# Patient Record
Sex: Male | Born: 1980 | Hispanic: Yes | Marital: Married | Smoking: Former smoker
Health system: Southern US, Community
[De-identification: ages and names within clinical notes are randomized; demographics above are authoritative.]

## PROBLEM LIST (undated history)

## (undated) DIAGNOSIS — I10 Essential (primary) hypertension: Secondary | ICD-10-CM

## (undated) DIAGNOSIS — J302 Other seasonal allergic rhinitis: Secondary | ICD-10-CM

## (undated) DIAGNOSIS — J45909 Unspecified asthma, uncomplicated: Secondary | ICD-10-CM

## (undated) HISTORY — DX: Unspecified asthma, uncomplicated: J45.909

## (undated) HISTORY — PX: NO PAST SURGERIES: SHX2092

## (undated) HISTORY — DX: Other seasonal allergic rhinitis: J30.2

---

## 2003-09-02 ENCOUNTER — Emergency Department (HOSPITAL_COMMUNITY): Admission: EM | Admit: 2003-09-02 | Discharge: 2003-09-02 | Payer: Self-pay | Admitting: Family Medicine

## 2003-12-02 ENCOUNTER — Encounter: Admission: RE | Admit: 2003-12-02 | Discharge: 2003-12-02 | Payer: Self-pay | Admitting: Specialist

## 2004-01-03 ENCOUNTER — Emergency Department (HOSPITAL_COMMUNITY): Admission: EM | Admit: 2004-01-03 | Discharge: 2004-01-03 | Payer: Self-pay | Admitting: Emergency Medicine

## 2012-03-25 ENCOUNTER — Ambulatory Visit (INDEPENDENT_AMBULATORY_CARE_PROVIDER_SITE_OTHER): Payer: 59 | Admitting: Family Medicine

## 2012-03-25 VITALS — BP 112/82 | HR 70 | Temp 98.1°F | Resp 16 | Ht 67.0 in | Wt 181.0 lb

## 2012-03-25 DIAGNOSIS — K625 Hemorrhage of anus and rectum: Secondary | ICD-10-CM

## 2012-03-25 DIAGNOSIS — IMO0001 Reserved for inherently not codable concepts without codable children: Secondary | ICD-10-CM

## 2012-03-25 DIAGNOSIS — R5381 Other malaise: Secondary | ICD-10-CM

## 2012-03-25 DIAGNOSIS — M791 Myalgia, unspecified site: Secondary | ICD-10-CM

## 2012-03-25 DIAGNOSIS — R42 Dizziness and giddiness: Secondary | ICD-10-CM

## 2012-03-25 DIAGNOSIS — R5383 Other fatigue: Secondary | ICD-10-CM

## 2012-03-25 DIAGNOSIS — R51 Headache: Secondary | ICD-10-CM

## 2012-03-25 LAB — POCT CBC
Granulocyte percent: 61.6 %G (ref 37–80)
HCT, POC: 53.4 % (ref 43.5–53.7)
MCV: 87.3 fL (ref 80–97)
MPV: 8.9 fL (ref 0–99.8)
POC Granulocyte: 3.8 (ref 2–6.9)
POC LYMPH PERCENT: 31.2 %L (ref 10–50)
POC MID %: 7.2 %M (ref 0–12)
Platelet Count, POC: 305 10*3/uL (ref 142–424)
RBC: 6.12 M/uL (ref 4.69–6.13)

## 2012-03-25 LAB — TSH: TSH: 2.4 u[IU]/mL (ref 0.350–4.500)

## 2012-03-25 LAB — COMPREHENSIVE METABOLIC PANEL
Albumin: 4.5 g/dL (ref 3.5–5.2)
Alkaline Phosphatase: 86 U/L (ref 39–117)
BUN: 9 mg/dL (ref 6–23)
Calcium: 9.7 mg/dL (ref 8.4–10.5)
Glucose, Bld: 100 mg/dL — ABNORMAL HIGH (ref 70–99)
Potassium: 4.5 mEq/L (ref 3.5–5.3)

## 2012-03-25 LAB — GLUCOSE, POCT (MANUAL RESULT ENTRY): POC Glucose: 80 mg/dl (ref 70–99)

## 2012-03-25 MED ORDER — MECLIZINE HCL 25 MG PO TABS: ORAL_TABLET | ORAL | Status: DC

## 2012-03-25 MED ORDER — HYDROCORTISONE ACE-PRAMOXINE 1-1 % RE CREA: TOPICAL_CREAM | Freq: Two times a day (BID) | RECTAL | Status: AC

## 2012-03-25 MED ORDER — MELOXICAM 15 MG PO TABS: ORAL_TABLET | ORAL | Status: DC

## 2012-03-25 NOTE — Patient Instructions (Addendum)
Drink lots of water.  Take acetaminophen 500 mg 2 tablets 4 times daily as necessary for aching  Use the prescription medicines for pain and inflammation 1 pill every night with your meals  Use the dizziness pill only when necessary  Use the cream around your bottom twice daily for one week. If you continue seeing more blood come back.

## 2012-03-25 NOTE — Progress Notes (Signed)
Subjective: 31 year old male who has been having problems over the last couple of months with more fatigue. He has headaches, in the frontal and occipital regions. Most of his symptoms seem to come on by midday. He works Holiday representative. He is from [place], married, has 2 small children. The dizziness is just a lightheaded but sometimes spends a little if he makes a quick movement. Mild tinnitus at times. He has been able to continue his work. He gets some epigastric discomfort. He has problems with aching and pains in his legs. He has tried doing some exercise and running of late. He has no pains in his legs from that.  Objective: Healthy-appearing young man in no major distress. His TMs are normal. Eyes PERRLA. EOMs intact. Throat clear. Neck supple without nodes or thyromegaly. No carotid bruits. Chest clear to auscultation. Heart regular without murmurs. Abdomen soft without mass or tenderness. He does describe a little bit of blood on the tissue when he wipes sometimes. Extremities are unremarkable.  Assessment: Headaches Dizziness Fatigue Muscle weakness Rectal irritation  Plan: CBC, complete metabolic panel, TSH, glucose  Results for orders placed in visit on 03/25/12  POCT CBC      Component Value Range   WBC 6.2  4.6 - 10.2 K/uL   Lymph, poc 1.9  0.6 - 3.4   POC LYMPH PERCENT 31.2  10 - 50 %L   MID (cbc) 0.4  0 - 0.9   POC MID % 7.2  0 - 12 %M   POC Granulocyte 3.8  2 - 6.9   Granulocyte percent 61.6  37 - 80 %G   RBC 6.12  4.69 - 6.13 M/uL   Hemoglobin 16.8  14.1 - 18.1 g/dL   HCT, POC 04.5  40.9 - 53.7 %   MCV 87.3  80 - 97 fL   MCH, POC 27.5  27 - 31.2 pg   MCHC 31.5 (*) 31.8 - 35.4 g/dL   RDW, POC 81.1     Platelet Count, POC 305  142 - 424 K/uL   MPV 8.9  0 - 99.8 fL  GLUCOSE, POCT (MANUAL RESULT ENTRY)      Component Value Range   POC Glucose 80  70 - 99 mg/dl   mobic antivert analpram Return if worse

## 2012-03-27 ENCOUNTER — Encounter: Payer: Self-pay | Admitting: *Deleted

## 2012-11-08 ENCOUNTER — Ambulatory Visit (INDEPENDENT_AMBULATORY_CARE_PROVIDER_SITE_OTHER): Payer: 59 | Admitting: Family Medicine

## 2012-11-08 VITALS — BP 130/80 | HR 81 | Temp 97.9°F | Resp 16 | Ht 66.5 in | Wt 187.0 lb

## 2012-11-08 DIAGNOSIS — R079 Chest pain, unspecified: Secondary | ICD-10-CM

## 2012-11-08 DIAGNOSIS — J302 Other seasonal allergic rhinitis: Secondary | ICD-10-CM

## 2012-11-08 DIAGNOSIS — J309 Allergic rhinitis, unspecified: Secondary | ICD-10-CM

## 2012-11-08 DIAGNOSIS — J45909 Unspecified asthma, uncomplicated: Secondary | ICD-10-CM

## 2012-11-08 MED ORDER — ALBUTEROL SULFATE HFA 108 (90 BASE) MCG/ACT IN AERS: 2.0000 | INHALATION_SPRAY | RESPIRATORY_TRACT | Status: DC | PRN

## 2012-11-08 MED ORDER — CETIRIZINE HCL 10 MG PO TABS: 10.0000 mg | ORAL_TABLET | Freq: Every day | ORAL | Status: DC

## 2012-11-08 MED ORDER — PREDNISONE 20 MG PO TABS: ORAL_TABLET | ORAL | Status: DC

## 2012-11-08 MED ORDER — ALBUTEROL SULFATE (2.5 MG/3ML) 0.083% IN NEBU
2.5000 mg | INHALATION_SOLUTION | Freq: Once | RESPIRATORY_TRACT | Status: AC
  Administered 2012-11-08: 2.5 mg via RESPIRATORY_TRACT

## 2012-11-08 MED ORDER — FLUTICASONE PROPIONATE 50 MCG/ACT NA SUSP: 2.0000 | Freq: Every day | NASAL | Status: DC

## 2012-11-08 NOTE — Patient Instructions (Addendum)
Asthma, Acute Bronchospasm Your exam shows you have asthma, or acute bronchospasm that acts like asthma. Bronchospasm means your air passages become narrowed. These conditions are due to inflammation and airway spasm that cause narrowing of the bronchial tubes in the lungs. This causes you to have wheezing and shortness of breath. CAUSES  Respiratory infections and allergies most often bring on these attacks. Smoking, air pollution, cold air, emotional upsets, and vigorous exercise can also bring them on.  TREATMENT   Treatment is aimed at making the narrowed airways larger. Mild asthma/bronchospasm is usually controlled with inhaled medicines. Albuterol is a common medicine that you breathe in to open spastic or narrowed airways. Some trade names for albuterol are Ventolin or Proventil. Steroid medicine is also used to reduce the inflammation when an attack is moderate or severe. Antibiotics (medications used to kill germs) are only used if a bacterial infection is present.  If you are pregnant and need to use Albuterol (Ventolin or Proventil), you can expect the baby to move more than usual shortly after the medicine is used. HOME CARE INSTRUCTIONS   Rest.  Drink plenty of liquids. This helps the mucus to remain thin and easily coughed up. Do not use caffeine or alcohol.  Do not smoke. Avoid being exposed to second-hand smoke.  You play a critical role in keeping yourself in good health. Avoid exposure to things that cause you to wheeze. Avoid exposure to things that cause you to have breathing problems. Keep your medications up-to-date and available. Carefully follow your doctor's treatment plan.  When pollen or pollution is bad, keep windows closed and use an air conditioner go to places with air conditioning. If you are allergic to furry pets or birds, find new homes for them or keep them outside.  Take your medicine exactly as prescribed.  Asthma requires careful medical attention. See  your caregiver for follow-up as advised. If you are more than [redacted] weeks pregnant and you were prescribed any new medications, let your Obstetrician know about the visit and how you are doing. Arrange a recheck. SEEK IMMEDIATE MEDICAL CARE IF:   You are getting worse.  You have trouble breathing. If severe, call 911.  You develop chest pain or discomfort.  You are throwing up or not drinking fluids.  You are not getting better within 24 hours.  You are coughing up yellow, green, brown, or bloody sputum.  You develop a fever over 102 F (38.9 C).  You have trouble swallowing. MAKE SURE YOU:   Understand these instructions.  Will watch your condition.  Will get help right away if you are not doing well or get worse. Document Released: 10/20/2006 Document Revised: 09/27/2011 Document Reviewed: 06/19/2007 [facility] Patient Information 2013 [place], [place]. Asthma, Adult Asthma is caused by narrowing of the air passages in the lungs. It may be triggered by pollen, dust, animal dander, molds, some foods, respiratory infections, exposure to smoke, exercise, emotional stress or other allergens (things that cause allergic reactions or allergies). Repeat attacks are common. HOME CARE INSTRUCTIONS   Use prescription medications as ordered by your caregiver.  Avoid pollen, dust, animal dander, molds, smoke and other things that cause attacks at home and at work.  You may have fewer attacks if you decrease dust in your home. Electrostatic air cleaners may help.  It may help to replace your pillows or mattress with materials less likely to cause allergies.  Talk to your caregiver about an action plan for managing asthma attacks at home,  including, the use of a peak flow meter which measures the severity of your asthma attack. An action plan can help minimize or stop the attack without having to seek medical care.  If you are not on a fluid restriction, drink 8 to 10 glasses of water each  day.  Always have a plan prepared for seeking medical attention, including, calling your physician, accessing local emergency care, and calling 911 (in the [place] for a severe attack.  Discuss possible exercise routines with your caregiver.  If animal dander is the cause of asthma, you may need to get rid of pets. SEEK MEDICAL CARE IF:   You have wheezing and shortness of breath even if taking medicine to prevent attacks.  You have muscle aches, chest pain or thickening of sputum.  Your sputum changes from clear or white to yellow, green, gray, or bloody.  You have any problems that may be related to the medicine you are taking (such as a rash, itching, swelling or trouble breathing). SEEK IMMEDIATE MEDICAL CARE IF:   Your usual medicines do not stop your wheezing or there is increased coughing and/or shortness of breath.  You have increased difficulty breathing.  You have a fever. MAKE SURE YOU:   Understand these instructions.  Will watch your condition.  Will get help right away if you are not doing well or get worse. Document Released: 07/05/2005 Document Revised: 09/27/2011 Document Reviewed: 02/21/2008 [facility] Patient Information 2013 [place], [place].

## 2012-11-08 NOTE — Progress Notes (Signed)
Subjective:    Patient ID: Darren Giles, male    DOB: 11-Nov-1980, 32 y.o.   MRN: [id number] Chief Complaint  Patient presents with  . Chest Pain   HPI  For the past 2 wks, he has had severe allergies.  Had had a chest pain and throat feels swollen closed.  Does get severe seasonal allergies every year.  Chest feels like lots of pressure and throat feels swollen keeping him from sleep last night.  Chest pain has been constant and central x 2 wks, worse w/ cough.  Feels like he has a fever but no sweats.  Is having some HA and sinus pressure as well as neck pain.  No ear pain or teeth pain. Throat feels very dry and pain with swallowing. Eyes itching and watering.  Using some otc outdoor allergy medicine for the past 1-2 wks - unknown type but it is 1 every day per box.  Has been hearing himself wheeze, but acutally not coughing to much.  Did have asthma a long time ago but only occ flairs up when he gets ill.  Past Medical History  Diagnosis Date  . Seasonal allergies    No current outpatient prescriptions on file prior to visit.   No current facility-administered medications on file prior to visit.   No Known Allergies   Review of Systems  Constitutional: Positive for fever and fatigue. Negative for chills, diaphoresis, activity change, appetite change and unexpected weight change.  HENT: Positive for congestion, sore throat, rhinorrhea, sneezing, trouble swallowing, neck pain, neck stiffness, postnasal drip and sinus pressure. Negative for ear pain, mouth sores and dental problem.   Eyes: Positive for discharge and itching.  Respiratory: Positive for cough, chest tightness, shortness of breath and wheezing.   Cardiovascular: Positive for chest pain.  Gastrointestinal: Negative for nausea, vomiting, abdominal pain, diarrhea and constipation.  Genitourinary: Negative for dysuria.  Musculoskeletal: Negative for myalgias and arthralgias.  Skin: Negative for rash.  Neurological:  Positive for tremors and headaches. Negative for syncope.  Hematological: Negative for adenopathy.  Psychiatric/Behavioral: Positive for sleep disturbance.      BP 130/80  Pulse 81  Temp(Src) 97.9 F (36.6 C) (Oral)  Resp 16  Ht 5' 6.5" (1.689 m)  Wt 187 lb (84.823 kg)  BMI 29.73 kg/m2  SpO2 99%  PF 450 L/min Goal peak flow 620 Objective:   Physical Exam  Constitutional: He is oriented to person, place, and time. He appears well-developed and well-nourished. No distress.  HENT:  Head: Normocephalic and atraumatic.  Right Ear: External ear and ear canal normal. Tympanic membrane is retracted. A middle ear effusion is present.  Left Ear: External ear and ear canal normal. Tympanic membrane is retracted. A middle ear effusion is present.  Nose: Mucosal edema and rhinorrhea present. Right sinus exhibits maxillary sinus tenderness. Right sinus exhibits no frontal sinus tenderness. Left sinus exhibits maxillary sinus tenderness. Left sinus exhibits no frontal sinus tenderness.  Mouth/Throat: Uvula is midline and mucous membranes are normal. No oropharyngeal exudate, posterior oropharyngeal edema or posterior oropharyngeal erythema.  Eyes: Conjunctivae are normal. Right eye exhibits no discharge. Left eye exhibits no discharge. No scleral icterus.  Neck: Normal range of motion. Neck supple. No thyromegaly present.  Cardiovascular: Normal rate, regular rhythm, normal heart sounds and intact distal pulses.   Pulmonary/Chest: Effort normal. No respiratory distress. He has decreased breath sounds. He has no wheezes. He has no rhonchi. He has no rales.  Diffusely decreased breath sounds with bibasilar wheezing with  forced expiration. After neb treatment, much improved air movement with occ RUL exp wheeze with forced exp but cleared w/ deep breathing.  Lymphadenopathy:       Head (right side): No submandibular, no tonsillar, no preauricular and no posterior auricular adenopathy present.       Head  (left side): No submandibular, no tonsillar, no preauricular and no posterior auricular adenopathy present.    He has no cervical adenopathy.       Right: No supraclavicular adenopathy present.       Left: No supraclavicular adenopathy present.  Neurological: He is alert and oriented to person, place, and time.  Skin: Skin is warm and dry. He is not diaphoretic. No erythema.  Psychiatric: He has a normal mood and affect. His behavior is normal.    [facility] reading (PRIMARY) by  Dr. Clelia Croft. EKG: NSR, no ischemic changes    s/p 2 nebs in office Assessment & Plan:  Seasonal allergic rhinitis  Chest pain - Plan: EKG 12-Lead  Reactive airway disease with wheezing - Plan: albuterol (PROVENTIL) (2.5 MG/3ML) 0.083% nebulizer solution 2.5 mg, albuterol (PROVENTIL) (2.5 MG/3ML) 0.083% nebulizer solution 2.5 mg alb neb x 2 given in office. Start prednisone taper w/ alb, flonase, and zyrtec.  I don't think pt needs an antibiotic at this time but if he gets worse w/ f, c, prod cough - call and can start him on doxy or a zpack.  Meds ordered this encounter  Medications  . albuterol (PROVENTIL) (2.5 MG/3ML) 0.083% nebulizer solution 2.5 mg    Sig:   . albuterol (PROVENTIL) (2.5 MG/3ML) 0.083% nebulizer solution 2.5 mg    Sig:   . predniSONE (DELTASONE) 20 MG tablet    Sig: Take 3 tabs daily x 3d, 2 tabs daily x 3d, 1 tab daily x 3d    Dispense:  18 tablet    Refill:  0  . fluticasone (FLONASE) 50 MCG/ACT nasal spray    Sig: Place 2 sprays into the nose daily.    Dispense:  16 g    Refill:  6  . albuterol (PROVENTIL HFA;VENTOLIN HFA) 108 (90 BASE) MCG/ACT inhaler    Sig: Inhale 2 puffs into the lungs every 4 (four) hours as needed for wheezing (cough, shortness of breath or wheezing.).    Dispense:  1 Inhaler    Refill:  3  . cetirizine (ZYRTEC) 10 MG tablet    Sig: Take 1 tablet (10 mg total) by mouth daily.    Dispense:  30 tablet    Refill:  3

## 2014-02-23 ENCOUNTER — Ambulatory Visit (INDEPENDENT_AMBULATORY_CARE_PROVIDER_SITE_OTHER): Payer: Self-pay

## 2014-02-23 ENCOUNTER — Ambulatory Visit (INDEPENDENT_AMBULATORY_CARE_PROVIDER_SITE_OTHER): Payer: Self-pay | Admitting: Emergency Medicine

## 2014-02-23 VITALS — BP 120/86 | HR 99 | Temp 97.7°F | Resp 16 | Ht 66.5 in | Wt 180.0 lb

## 2014-02-23 DIAGNOSIS — M549 Dorsalgia, unspecified: Secondary | ICD-10-CM

## 2014-02-23 MED ORDER — MELOXICAM 7.5 MG PO TABS: 7.5000 mg | ORAL_TABLET | Freq: Every day | ORAL | Status: DC

## 2014-02-23 MED ORDER — CYCLOBENZAPRINE HCL 10 MG PO TABS: ORAL_TABLET | ORAL | Status: DC

## 2014-02-23 NOTE — Progress Notes (Addendum)
Subjective:  This chart was scribed for Lesle ChrisSteven Daub, MD by Elveria Risingimelie Horne, Medial Scribe. This patient was seen in room 3 and the patient's care was started at 3:40 PM.    Patient ID: Darren Giles, male    DOB: 01-Jun-1981, 33 y.o.   MRN: [id number]  HPI HPI Comments: Darren Giles is a 33 y.o. male who presents to the [facility] complaining of right shoulder pain, ongoing for five weeks. Patient is a laborer and operates heavy equipment. Patient reports treatment with ibuprofen, but denies relief. Patient reports same pain of few months ago his current pain is more severe. Patients denies weakness or tingling in his extremities or hands; stating the pain is non-radiating.   Patient reports tick bite six months ago. He now has multiple ingrown hairs in the scar resulting from the bite.   There are no active problems to display for this patient.  Past Medical History  Diagnosis Date  . Seasonal allergies    No past surgical history on file. No Known Allergies Prior to Admission medications   Not on File   History   Social History  . Marital Status: Married    Spouse Name: N/A    Number of Children: N/A  . Years of Education: N/A   Occupational History  . Not on file.   Social History Main Topics  . Smoking status: Former Games developermoker  . Smokeless tobacco: Not on file  . Alcohol Use: Yes     Comment: social use  . Drug Use: No  . Sexual Activity: Not on file   Other Topics Concern  . Not on file   Social History Narrative  . No narrative on file    Review of Systems  Constitutional: Negative for fever and chills.  Musculoskeletal: Positive for arthralgias and back pain.  Neurological: Negative for weakness and numbness.       Objective:   Physical Exam  Nursing note and vitals reviewed.   CONSTITUTIONAL: Well developed/well nourished HEAD: Normocephalic/atraumatic EYES: EOMI/PERRL ENMT: Mucous membranes moist NECK: supple no meningeal  signs SPINE:entire spine nontender CV: S1/S2 noted, no murmurs/rubs/gallops noted LUNGS: Lungs are clear to auscultation bilaterally, no apparent distress ABDOMEN: soft, nontender, no rebound or guarding; 3x14mm red area adjacent to belly button showing multiple ingrown hairs which were removed with forceps and 25 gauge needle GU:no cva tenderness NEURO: Pt is awake/alert, moves all extremitiesx4 EXTREMITIES: pulses normal, full ROM SHOULDER: tender along lower medial border of scapula on the right SKIN: warm, color normal PSYCH: no abnormalities of mood noted  Filed Vitals:   02/23/14 1535  BP: 120/86  Pulse: 99  Temp: 97.7 F (36.5 C)  TempSrc: Oral  Resp: 16  Height: 5' 6.5" (1.689 m)  Weight: 180 lb (81.647 kg)  SpO2: 99%   [facility] reading (PRIMARY) by  Dr.Daub on the lateral view there is a questionable irregular 2 x 3 cm spiculated area superior to the hilum     Assessment & Plan:  I'm concerned about the area on his chest x-ray. I did remove what appears to be some ingrown hairs right side of the abdomen. Will give him Mobic and Flexeril for his shoulder pain. The radiologist feels a chest x-ray abnormality is secondary to overlap of shadows. The patient is going to come back in 3 months for repeat chest x-ray just to be safe. Treat him for shoulder pain. He also was advised he may need to return to clinic to remove  some more ingrown hairs right side of the abdomen.  I personally performed the services described in this documentation, which was scribed in my presence. The recorded information has been reviewed and is accurate.

## 2014-04-19 ENCOUNTER — Ambulatory Visit (INDEPENDENT_AMBULATORY_CARE_PROVIDER_SITE_OTHER): Payer: BC Managed Care – PPO

## 2014-04-19 ENCOUNTER — Ambulatory Visit (INDEPENDENT_AMBULATORY_CARE_PROVIDER_SITE_OTHER): Payer: BC Managed Care – PPO | Admitting: Emergency Medicine

## 2014-04-19 VITALS — BP 118/80 | HR 77 | Temp 97.6°F | Resp 18 | Ht 67.0 in | Wt 181.0 lb

## 2014-04-19 DIAGNOSIS — M25511 Pain in right shoulder: Secondary | ICD-10-CM

## 2014-04-19 DIAGNOSIS — R079 Chest pain, unspecified: Secondary | ICD-10-CM

## 2014-04-19 LAB — POCT CBC
Granulocyte percent: 56.5 %G (ref 37–80)
HEMATOCRIT: 50.9 % (ref 43.5–53.7)
Hemoglobin: 16.8 g/dL (ref 14.1–18.1)
Lymph, poc: 2.2 (ref 0.6–3.4)
MCH: 28.1 pg (ref 27–31.2)
MCHC: 33 g/dL (ref 31.8–35.4)
MCV: 85.1 fL (ref 80–97)
MID (CBC): 0.4 (ref 0–0.9)
MPV: 7.6 fL (ref 0–99.8)
PLATELET COUNT, POC: 240 10*3/uL (ref 142–424)
POC Granulocyte: 3.3 (ref 2–6.9)
POC LYMPH PERCENT: 37.2 %L (ref 10–50)
POC MID %: 6.3 %M (ref 0–12)
RBC: 5.98 M/uL (ref 4.69–6.13)
RDW, POC: 12.6 %
WBC: 5.8 10*3/uL (ref 4.6–10.2)

## 2014-04-19 LAB — POCT SEDIMENTATION RATE: POCT SED RATE: 4 mm/hr (ref 0–22)

## 2014-04-19 MED ORDER — NABUMETONE 500 MG PO TABS: 500.0000 mg | ORAL_TABLET | Freq: Two times a day (BID) | ORAL | Status: DC

## 2014-04-19 MED ORDER — TIZANIDINE HCL 4 MG PO CAPS: ORAL_CAPSULE | ORAL | Status: DC

## 2014-04-19 NOTE — Progress Notes (Signed)
   Subjective:    Patient ID: Judithann SheenManuel Perez, male    DOB: 21-Aug-1980, 33 y.o.   MRN: [id number]  HPI since his last visit here patient continues to have right-sided chest discomfort as well as a rubbing sensation in his left anterior chest. He feels this when he takes a full breath in. He denies any shortness of breath he is nonsmoker he has not coughed up any blood he has not clinically been ill except he complains of persistent fatigue he does work as a Corporate investment bankerconstruction worker    Review of Systems     Objective:   Physical Exam  Constitutional: He appears well-developed and well-nourished.  HENT:  Head: Normocephalic.  Eyes: Pupils are equal, round, and reactive to light.  Neck: No thyromegaly present.  Cardiovascular: Normal rate and regular rhythm.   When I listened to the patient I felt I could hear a rub. Partners Dr. Katrinka BlazingSmith and Dallas Schimkeopeland cannot appreciate a rub.  EKG J-point elevation V1 V2 no acute changes [facility] reading (PRIMARY) by  Dr Cleta Albertsaub no acute disease Results for orders placed in visit on 04/19/14  POCT CBC      Result Value Ref Range   WBC 5.8  4.6 - 10.2 K/uL   Lymph, poc 2.2  0.6 - 3.4   POC LYMPH PERCENT 37.2  10 - 50 %L   MID (cbc) 0.4  0 - 0.9   POC MID % 6.3  0 - 12 %M   POC Granulocyte 3.3  2 - 6.9   Granulocyte percent 56.5  37 - 80 %G   RBC 5.98  4.69 - 6.13 M/uL   Hemoglobin 16.8  14.1 - 18.1 g/dL   HCT, POC 40.950.9  81.143.5 - 53.7 %   MCV 85.1  80 - 97 fL   MCH, POC 28.1  27 - 31.2 pg   MCHC 33.0  31.8 - 35.4 g/dL   RDW, POC 91.412.6     Platelet Count, POC 240  142 - 424 K/uL   MPV 7.6  0 - 99.8 fL  POCT SEDIMENTATION RATE      Result Value Ref Range   POCT SED RATE 4  0 - 22 mm/hr         Assessment & Plan:  I initially thought I could hear a rub but this could not be confirmed. His sedimentation rate is only 4 and his EKG is without acute changes and chest x-ray is normal. We'll treat this as musculoskeletal pain with a muscle relaxant at night and Relafen  twice a day

## 2014-04-20 LAB — COMPREHENSIVE METABOLIC PANEL
ALK PHOS: 99 U/L (ref 39–117)
ALT: 24 U/L (ref 0–53)
AST: 20 U/L (ref 0–37)
Albumin: 4.5 g/dL (ref 3.5–5.2)
BILIRUBIN TOTAL: 0.3 mg/dL (ref 0.2–1.2)
BUN: 9 mg/dL (ref 6–23)
CO2: 28 meq/L (ref 19–32)
Calcium: 9.4 mg/dL (ref 8.4–10.5)
Chloride: 101 mEq/L (ref 96–112)
Creat: 0.91 mg/dL (ref 0.50–1.35)
Glucose, Bld: 102 mg/dL — ABNORMAL HIGH (ref 70–99)
Potassium: 4.2 mEq/L (ref 3.5–5.3)
SODIUM: 137 meq/L (ref 135–145)
TOTAL PROTEIN: 7.3 g/dL (ref 6.0–8.3)

## 2014-04-20 LAB — C-REACTIVE PROTEIN: CRP: <0.5 mg/dL (ref <0.60)

## 2014-04-20 LAB — RHEUMATOID FACTOR: Rhuematoid fact SerPl-aCnc: <10 IU/mL (ref <=14)

## 2014-04-22 LAB — ANA: ANA: NEGATIVE

## 2015-08-04 ENCOUNTER — Ambulatory Visit (INDEPENDENT_AMBULATORY_CARE_PROVIDER_SITE_OTHER): Payer: BLUE CROSS/BLUE SHIELD | Admitting: Urgent Care

## 2015-08-04 VITALS — BP 120/80 | HR 92 | Temp 98.2°F | Resp 20 | Ht 67.72 in | Wt 183.2 lb

## 2015-08-04 DIAGNOSIS — R109 Unspecified abdominal pain: Secondary | ICD-10-CM

## 2015-08-04 DIAGNOSIS — R300 Dysuria: Secondary | ICD-10-CM

## 2015-08-04 DIAGNOSIS — R350 Frequency of micturition: Secondary | ICD-10-CM

## 2015-08-04 DIAGNOSIS — R339 Retention of urine, unspecified: Secondary | ICD-10-CM

## 2015-08-04 DIAGNOSIS — E860 Dehydration: Secondary | ICD-10-CM | POA: Diagnosis not present

## 2015-08-04 DIAGNOSIS — J029 Acute pharyngitis, unspecified: Secondary | ICD-10-CM

## 2015-08-04 DIAGNOSIS — K30 Functional dyspepsia: Secondary | ICD-10-CM | POA: Diagnosis not present

## 2015-08-04 LAB — POCT URINALYSIS DIP (MANUAL ENTRY)
Bilirubin, UA: NEGATIVE
Blood, UA: NEGATIVE
GLUCOSE UA: NEGATIVE
Ketones, POC UA: NEGATIVE
LEUKOCYTES UA: NEGATIVE
NITRITE UA: NEGATIVE
PROTEIN UA: NEGATIVE
Spec Grav, UA: >=1.03
UROBILINOGEN UA: 0.2
pH, UA: 5.5

## 2015-08-04 LAB — COMPREHENSIVE METABOLIC PANEL
ALT: 19 U/L (ref 9–46)
AST: 15 U/L (ref 10–40)
Albumin: 4.4 g/dL (ref 3.6–5.1)
Alkaline Phosphatase: 96 U/L (ref 40–115)
BUN: 8 mg/dL (ref 7–25)
CHLORIDE: 99 mmol/L (ref 98–110)
CO2: 29 mmol/L (ref 20–31)
CREATININE: 0.88 mg/dL (ref 0.60–1.35)
Calcium: 9.2 mg/dL (ref 8.6–10.3)
GLUCOSE: 85 mg/dL (ref 65–99)
POTASSIUM: 3.9 mmol/L (ref 3.5–5.3)
SODIUM: 137 mmol/L (ref 135–146)
TOTAL PROTEIN: 7.3 g/dL (ref 6.1–8.1)
Total Bilirubin: 0.4 mg/dL (ref 0.2–1.2)

## 2015-08-04 LAB — POCT CBC
Granulocyte percent: 71.6 %G (ref 37–80)
HCT, POC: 50.4 % (ref 43.5–53.7)
HEMOGLOBIN: 17.4 g/dL (ref 14.1–18.1)
Lymph, poc: 1.3 (ref 0.6–3.4)
MCH, POC: 28.6 pg (ref 27–31.2)
MCHC: 34.6 g/dL (ref 31.8–35.4)
MCV: 82.5 fL (ref 80–97)
MID (CBC): 0.3 (ref 0–0.9)
MPV: 7.1 fL (ref 0–99.8)
PLATELET COUNT, POC: 219 10*3/uL (ref 142–424)
POC Granulocyte: 4 (ref 2–6.9)
POC LYMPH %: 23.8 % (ref 10–50)
POC MID %: 4.6 %M (ref 0–12)
RBC: 6.11 M/uL (ref 4.69–6.13)
RDW, POC: 12.3 %
WBC: 5.6 10*3/uL (ref 4.6–10.2)

## 2015-08-04 LAB — POCT INFLUENZA A/B
Influenza A, POC: NEGATIVE
Influenza B, POC: NEGATIVE

## 2015-08-04 LAB — POC MICROSCOPIC URINALYSIS (UMFC)

## 2015-08-04 LAB — POCT GLYCOSYLATED HEMOGLOBIN (HGB A1C): HEMOGLOBIN A1C: 5.5

## 2015-08-04 NOTE — Progress Notes (Signed)
MRN: [id number] DOB: 06-15-1981  Subjective:   Darren Giles is a 35 y.o. male presenting for chief complaint of Fever; Cough; Sore Throat; and Dizziness  Reports 4 day history of bilateral flank pain, subjective fever, intermittent dizziness, sinus pressure, sinus pain, sore throat, productive cough, dark urine, mild dysuria, malaise, intermittent nausea and mild belly pain, intermittent constipation. Patient admits polyuria, polydipsia. Patient eats very unhealthily, does not exercise, works as a Location managermachine operator in [employer]. Denies family history of diabetes.  Darren Giles currently has no medications in their medication list. Also has No Known Allergies.  Darren Giles  has a past medical history of Seasonal allergies and Asthma. Also  has no past surgical history on file.  Objective:   Vitals: BP 120/80 mmHg  Pulse 92  Temp(Src) 98.2 F (36.8 C) (Oral)  Resp 20  Ht 5' 7.72" (1.72 m)  Wt 183 lb 3.2 oz (83.099 kg)  BMI 28.09 kg/m2  SpO2 98%  Physical Exam  Constitutional: He is oriented to person, place, and time. He appears well-developed and well-nourished.  HENT:  TM's intact bilaterally, no effusions or erythema. Nasal turbinates erythematous with thick yellow mucus. Nasal passages minimally patent. Mild bilateral maxillary sinus tenderness. Oropharynx without exudates, erythema or abscesses.  Eyes: Right eye exhibits no discharge. Left eye exhibits no discharge. No scleral icterus.  Neck: Normal range of motion. Neck supple.  Cardiovascular: Normal rate, regular rhythm and intact distal pulses.  Exam reveals no gallop and no friction rub.   No murmur heard. Pulmonary/Chest: No respiratory distress. He has no wheezes. He has no rales.  Abdominal: Soft. Bowel sounds are normal. He exhibits no distension and no mass. There is tenderness (generalized).  Lymphadenopathy:    He has no cervical adenopathy.  Neurological: He is alert and oriented to person, place, and time.  Skin:  Skin is warm and dry. No rash noted. No erythema. No pallor.   Results for orders placed or performed in visit on 08/04/15 (from the past 24 hour(s))  POCT urinalysis dipstick     Status: None   Collection Time: 08/04/15  2:38 PM  Result Value Ref Range   Color, UA yellow yellow   Clarity, UA clear clear   Glucose, UA negative negative   Bilirubin, UA negative negative   Ketones, POC UA negative negative   Spec Grav, UA >=1.030    Blood, UA negative negative   pH, UA 5.5    Protein Ur, POC negative negative   Urobilinogen, UA 0.2    Nitrite, UA Negative Negative   Leukocytes, UA Negative Negative  POCT Microscopic Urinalysis ([facility])     Status: Abnormal   Collection Time: 08/04/15  2:38 PM  Result Value Ref Range   WBC,UR,HPF,POC Few (A) None WBC/hpf   RBC,UR,HPF,POC None None RBC/hpf   Bacteria None None, Too numerous to count   Mucus Present (A) Absent   Epithelial Cells, UR Per Microscopy None None, Too numerous to count cells/hpf  POCT CBC     Status: None   Collection Time: 08/04/15  2:38 PM  Result Value Ref Range   WBC 5.6 4.6 - 10.2 K/uL   Lymph, poc 1.3 0.6 - 3.4   POC LYMPH PERCENT 23.8 10 - 50 %L   MID (cbc) 0.3 0 - 0.9   POC MID % 4.6 0 - 12 %M   POC Granulocyte 4.0 2 - 6.9   Granulocyte percent 71.6 37 - 80 %G   RBC 6.11 4.69 - 6.13 M/uL  Hemoglobin 17.4 14.1 - 18.1 g/dL   HCT, POC 16.1 09.6 - 53.7 %   MCV 82.5 80 - 97 fL   MCH, POC 28.6 27 - 31.2 pg   MCHC 34.6 31.8 - 35.4 g/dL   RDW, POC 04.5 %   Platelet Count, POC 219 142 - 424 K/uL   MPV 7.1 0 - 99.8 fL  POCT glycosylated hemoglobin (Hb A1C)     Status: None   Collection Time: 08/04/15  2:40 PM  Result Value Ref Range   Hemoglobin A1C 5.5   POCT Influenza A/B     Status: None   Collection Time: 08/04/15  2:40 PM  Result Value Ref Range   Influenza A, POC Negative Negative   Influenza B, POC Negative Negative   Assessment and Plan :   1. Flank pain 2. Belly pain 3. Incomplete emptying of  bladder 4. Sore throat 5. Dysuria 6. Urinary frequency 7. Dehydration - Labs pending. POC labs and physical exam findings very reassuring. Likely undergoing viral syndrome. Patient had significant improvement with 2L of fluids in clinic. Advised supportive care for now. RTC in 2 days if no improvement.  Wallis Bamberg, PA-C [facility] [phone] 08/04/2015 1:57 PM

## 2015-08-04 NOTE — Patient Instructions (Signed)
-   Por favor descansa y reposa. - Necesito que comas comidas livianas y que tomes suficiente agua, minimo 2 litros al dia. Puedes comer sopas con arros o vegetales.  - Si no mejoras en un par de dias, por favor regresa a nuestra clinica.

## 2015-08-05 ENCOUNTER — Encounter: Payer: Self-pay | Admitting: Urgent Care

## 2015-12-09 ENCOUNTER — Ambulatory Visit (INDEPENDENT_AMBULATORY_CARE_PROVIDER_SITE_OTHER): Payer: BLUE CROSS/BLUE SHIELD | Admitting: Physician Assistant

## 2015-12-09 VITALS — BP 122/82 | HR 77 | Temp 97.8°F | Resp 16 | Ht 67.0 in | Wt 189.0 lb

## 2015-12-09 DIAGNOSIS — R42 Dizziness and giddiness: Secondary | ICD-10-CM

## 2015-12-09 DIAGNOSIS — R51 Headache: Secondary | ICD-10-CM

## 2015-12-09 DIAGNOSIS — R519 Headache, unspecified: Secondary | ICD-10-CM

## 2015-12-09 LAB — POCT CBC
GRANULOCYTE PERCENT: 65.9 % (ref 37–80)
HCT, POC: 46.7 % (ref 43.5–53.7)
Hemoglobin: 16.6 g/dL (ref 14.1–18.1)
Lymph, poc: 2.3 (ref 0.6–3.4)
MCH: 29.6 pg (ref 27–31.2)
MCHC: 35.6 g/dL — AB (ref 31.8–35.4)
MCV: 83.2 fL (ref 80–97)
MID (cbc): 0.2 (ref 0–0.9)
MPV: 7.7 fL (ref 0–99.8)
PLATELET COUNT, POC: 243 10*3/uL (ref 142–424)
POC Granulocyte: 4.8 (ref 2–6.9)
POC LYMPH PERCENT: 31 %L (ref 10–50)
POC MID %: 3.1 %M (ref 0–12)
RBC: 5.61 M/uL (ref 4.69–6.13)
RDW, POC: 12.6 %
WBC: 7.3 10*3/uL (ref 4.6–10.2)

## 2015-12-09 LAB — POCT URINALYSIS DIP (MANUAL ENTRY)
BILIRUBIN UA: NEGATIVE
Glucose, UA: NEGATIVE
Ketones, POC UA: NEGATIVE
Leukocytes, UA: NEGATIVE
Nitrite, UA: NEGATIVE
PH UA: 5.5
PROTEIN UA: NEGATIVE
RBC UA: NEGATIVE
Spec Grav, UA: 1.015
UROBILINOGEN UA: 0.2

## 2015-12-09 LAB — COMPLETE METABOLIC PANEL WITH GFR
ALBUMIN: 4.1 g/dL (ref 3.6–5.1)
ALK PHOS: 100 U/L (ref 40–115)
ALT: 19 U/L (ref 9–46)
AST: 15 U/L (ref 10–40)
BUN: 8 mg/dL (ref 7–25)
CO2: 28 mmol/L (ref 20–31)
Calcium: 9.2 mg/dL (ref 8.6–10.3)
Chloride: 101 mmol/L (ref 98–110)
Creat: 0.86 mg/dL (ref 0.60–1.35)
GFR, Est African American: >89 mL/min (ref >=60)
GLUCOSE: 94 mg/dL (ref 65–99)
POTASSIUM: 3.9 mmol/L (ref 3.5–5.3)
SODIUM: 137 mmol/L (ref 135–146)
Total Bilirubin: 0.4 mg/dL (ref 0.2–1.2)
Total Protein: 6.9 g/dL (ref 6.1–8.1)

## 2015-12-09 LAB — POCT SEDIMENTATION RATE: POCT SED RATE: 4 mm/h (ref 0–22)

## 2015-12-09 LAB — POCT GLYCOSYLATED HEMOGLOBIN (HGB A1C): Hemoglobin A1C: 5.4

## 2015-12-09 NOTE — Patient Instructions (Signed)
     IF you received an x-ray today, you will receive an invoice from [facility]. Please contact [facility] at [phone] with questions or concerns regarding your invoice.   IF you received labwork today, you will receive an invoice from [facility]. Please contact [facility] at [phone] with questions or concerns regarding your invoice.   Our billing staff will not be able to assist you with questions regarding bills from these companies.  You will be contacted with the lab results as soon as they are available. The fastest way to get your results is to activate your My Chart account. Instructions are located on the last page of this paperwork. If you have not heard from us regarding the results in 2 weeks, please contact this office.      

## 2015-12-09 NOTE — Progress Notes (Signed)
12/09/2015 4:03 PM   DOB: Nov 03, 1980 / MRN: [id number]  SUBJECTIVE:  Darren Giles is a 35 y.o. male presenting for the evaluation of HA. He is not having HA at this time.  Reports this started five days ago and he associates pain and weakness in his legs and arms, fatigue and malaise, and tactile fever.  He has not tried anything for his symptoms.  States he drinks lots of water.    He has No Known Allergies.   He  has a past medical history of Seasonal allergies and Asthma.    He  reports that he has quit smoking. He does not have any smokeless tobacco history on file. He reports that he drinks alcohol. He reports that he does not use illicit drugs. He  has no sexual activity history on file. The patient  has no past surgical history on file.  His family history includes Hypotension in his mother.  Review of Systems  Constitutional: Negative for fever and chills.  Eyes: Negative for blurred vision.  Respiratory: Negative for cough and shortness of breath.   Cardiovascular: Negative for chest pain.  Gastrointestinal: Negative for nausea and abdominal pain.  Genitourinary: Negative for dysuria, urgency and frequency.  Musculoskeletal: Positive for myalgias.  Skin: Negative for rash.  Neurological: Positive for dizziness and headaches. Negative for tingling.  Psychiatric/Behavioral: Negative for depression. The patient is nervous/anxious.     Problem list and medications reviewed and updated by myself where necessary, and exist elsewhere in the encounter.   OBJECTIVE:  BP 122/82 mmHg  Pulse 77  Temp(Src) 97.8 F (36.6 C) (Oral)  Resp 16  Ht  (1.702 m)  Wt 189 lb (85.73 kg)  BMI 29.59 kg/m2  SpO2 99%  Physical Exam  Constitutional: He is oriented to person, place, and time. He appears well-developed. He does not appear ill.  Eyes: Conjunctivae and EOM are normal. Pupils are equal, round, and reactive to light.  Cardiovascular: Normal rate, regular rhythm and normal  heart sounds.   Pulmonary/Chest: Effort normal and breath sounds normal.  Abdominal: He exhibits no distension.  Musculoskeletal: Normal range of motion.  Neurological: He is alert and oriented to person, place, and time. He has normal strength. He displays no tremor. No cranial nerve deficit or sensory deficit. He exhibits normal muscle tone. Coordination and gait normal. GCS eye subscore is 4. GCS verbal subscore is 5. GCS motor subscore is 6.  Reflex Scores:      Tricep reflexes are 2+ on the right side and 2+ on the left side.      Bicep reflexes are 2+ on the right side and 2+ on the left side.      Brachioradialis reflexes are 2+ on the right side and 2+ on the left side.      Patellar reflexes are 2+ on the right side and 2+ on the left side.      Achilles reflexes are 2+ on the right side and 2+ on the left side. Skin: Skin is warm and dry. He is not diaphoretic.  Psychiatric: His mood appears anxious.  Nursing note and vitals reviewed.   Results for orders placed or performed in visit on 12/09/15 (from the past 72 hour(s))  POCT glycosylated hemoglobin (Hb A1C)     Status: None   Collection Time: 12/09/15  3:14 PM  Result Value Ref Range   Hemoglobin A1C 5.4   POCT CBC     Status: Abnormal   Collection Time: 12/09/15  3:14 PM  Result Value Ref Range   WBC 7.3 4.6 - 10.2 K/uL   Lymph, poc 2.3 0.6 - 3.4   POC LYMPH PERCENT 31.0 10 - 50 %L   MID (cbc) 0.2 0 - 0.9   POC MID % 3.1 0 - 12 %M   POC Granulocyte 4.8 2 - 6.9   Granulocyte percent 65.9 37 - 80 %G   RBC 5.61 4.69 - 6.13 M/uL   Hemoglobin 16.6 14.1 - 18.1 g/dL   HCT, POC 16.146.7 09.643.5 - 53.7 %   MCV 83.2 80 - 97 fL   MCH, POC 29.6 27 - 31.2 pg   MCHC 35.6 (A) 31.8 - 35.4 g/dL   RDW, POC 04.512.6 %   Platelet Count, POC 243 142 - 424 K/uL   MPV 7.7 0 - 99.8 fL  POCT urinalysis dipstick     Status: None   Collection Time: 12/09/15  3:37 PM  Result Value Ref Range   Color, UA yellow yellow   Clarity, UA clear clear    Glucose, UA negative negative   Bilirubin, UA negative negative   Ketones, POC UA negative negative   Spec Grav, UA 1.015    Blood, UA negative negative   pH, UA 5.5    Protein Ur, POC negative negative   Urobilinogen, UA 0.2    Nitrite, UA Negative Negative   Leukocytes, UA Negative Negative    No results found.  ASSESSMENT AND PLAN  Darren Giles was seen today for headache and dizziness.  Diagnoses and all orders for this visit:  Dizziness: His exam and work up are reassuring.  I have discussed these findings with him and he and I agree that we can wait on a referral or imaging given that he is symptom free at this time.   This may be stress.  -     POCT glycosylated hemoglobin (Hb A1C) -     POCT CBC -     POCT urinalysis dipstick -     COMPLETE METABOLIC PANEL WITH GFR -     TSH -     EKG 12-Lead  Temporal headache -     POCT SEDIMENTATION RATE    The patient was advised to call or return to clinic if he does not see an improvement in symptoms or to seek the care of the closest emergency department if he worsens with the above plan.   Deliah BostonMichael Thaniel Coluccio, MHS, PA-C [facility] 12/09/2015 4:03 PM

## 2015-12-10 ENCOUNTER — Encounter: Payer: Self-pay | Admitting: *Deleted

## 2015-12-10 LAB — TSH: TSH: 1.85 mIU/L (ref 0.40–4.50)

## 2017-07-09 ENCOUNTER — Ambulatory Visit (INDEPENDENT_AMBULATORY_CARE_PROVIDER_SITE_OTHER): Payer: BLUE CROSS/BLUE SHIELD

## 2017-07-09 ENCOUNTER — Encounter (HOSPITAL_COMMUNITY): Payer: Self-pay | Admitting: Emergency Medicine

## 2017-07-09 ENCOUNTER — Ambulatory Visit (HOSPITAL_COMMUNITY)
Admission: EM | Admit: 2017-07-09 | Discharge: 2017-07-09 | Disposition: A | Discharge status: Home / Self Care | Payer: BLUE CROSS/BLUE SHIELD | Attending: Family Medicine | Admitting: Family Medicine

## 2017-07-09 ENCOUNTER — Other Ambulatory Visit: Payer: Self-pay

## 2017-07-09 DIAGNOSIS — R030 Elevated blood-pressure reading, without diagnosis of hypertension: Secondary | ICD-10-CM

## 2017-07-09 DIAGNOSIS — R1084 Generalized abdominal pain: Secondary | ICD-10-CM | POA: Diagnosis not present

## 2017-07-09 DIAGNOSIS — R109 Unspecified abdominal pain: Secondary | ICD-10-CM

## 2017-07-09 DIAGNOSIS — K5909 Other constipation: Secondary | ICD-10-CM

## 2017-07-09 HISTORY — DX: Essential (primary) hypertension: I10

## 2017-07-09 MED ORDER — POLYETHYLENE GLYCOL 3350 17 G PO PACK: PACK | ORAL | 0 refills | Status: AC

## 2017-07-09 NOTE — Discharge Instructions (Signed)
Will treat your constipation in hopes of helping with your abdominal pain. Try to increase fiber in your diet as well , this is outlined in the handout.  You also have a questionable area on your xray of possible kidney stone. But this could also be a non-specific vein stone. Your symptoms and your Urine do not make me think a kidney stone, but this is something to be mindful of.  Your blood pressure is elevated, so finding a PCP is important. I have sent a request for this, but you can also [facility] as we discussed.  I hope you feel better. If your symptoms worsen the please go to the ED for further imaging.  Happy Holidays.

## 2017-07-09 NOTE — ED Triage Notes (Signed)
Pt reports lower abdominal pain and groin pain for 3-4 weeks.  He also reports some urinary issues, mostly not able to empty his bladder.

## 2017-07-09 NOTE — ED Provider Notes (Signed)
[facility]    CSN: [id number] Arrival date & time: 07/09/17  1207     History   Chief Complaint Chief Complaint  Patient presents with  . Abdominal Pain    HPI Darren Giles is a 36 y.o. male.   Presents with episodic abdominal pain x 3-4 weeks. He describes this has along both sides of his abdomen with intermittent constipation. At times his pain moves to his groin region. No fever or chills. No diarrhea. Noted nocturia at times. No past history of abdominal issues. No medications. See full ROS. No history of HTN is noted.       Past Medical History:  Diagnosis Date  . Asthma   . Hypertension   . Seasonal allergies     There are no active problems to display for this patient.   History reviewed. No pertinent surgical history.     Home Medications    Prior to Admission medications   Not on File    Family History Family History  Problem Relation Age of Onset  . Hypotension Mother     Social History Social History   Tobacco Use  . Smoking status: Former Games developermoker  . Smokeless tobacco: Never Used  Substance Use Topics  . Alcohol use: Yes    Comment: social use  . Drug use: No     Allergies   Patient has no known allergies.   Review of Systems Review of Systems  Constitutional: Negative for chills and fever.  Gastrointestinal: Positive for abdominal pain and constipation. Negative for blood in stool, diarrhea, nausea, rectal pain and vomiting.  Endocrine: Positive for polydipsia.  Genitourinary: Negative for difficulty urinating and flank pain.  Musculoskeletal: Negative for back pain.  Skin: Negative.      Physical Exam Triage Vital Signs ED Triage Vitals  Enc Vitals Group     BP 07/09/17 1255 (!) 149/101     Pulse Rate 07/09/17 1255 88     Resp --      Temp 07/09/17 1255 97.9 F (36.6 C)     Temp Source 07/09/17 1255 Oral     SpO2 07/09/17 1255 98 %     Weight --      Height --      Head Circumference --      Peak Flow --      Pain Score 07/09/17 1252 5     Pain Loc --      Pain Edu? --      Excl. in GC? --    No data found.  Updated Vital Signs BP (!) 149/101 (BP Location: Right Arm)   Pulse 88   Temp 97.9 F (36.6 C) (Oral)   SpO2 98%   Visual Acuity Right Eye Distance:   Left Eye Distance:   Bilateral Distance:    Right Eye Near:   Left Eye Near:    Bilateral Near:     Physical Exam  Constitutional: He is oriented to person, place, and time. He appears well-developed and well-nourished.  Non-toxic appearance. He does not appear ill. No distress.  Comfortable on the exam table in NAD  Cardiovascular: Normal rate.  Pulmonary/Chest: Effort normal and breath sounds normal.  Abdominal: Bowel sounds are normal. He exhibits mass. There is no hepatosplenomegaly. There is generalized tenderness. There is no rigidity, no rebound, no guarding, no tenderness at McBurney's point and negative Murphy's sign.  Probable stool noted in the RUQ   Neurological: He is alert and oriented to person,  place, and time.  Skin: Skin is warm and dry. No rash noted.  Psychiatric: He has a normal mood and affect. His behavior is normal.  Nursing note and vitals reviewed.    UC Treatments / Results  Labs (all labs ordered are listed, but only abnormal results are displayed) Labs Reviewed - No data to display  EKG  EKG Interpretation None       Radiology No results found.  Procedures Procedures (including critical care time)  Medications Ordered in UC Medications - No data to display   Initial Impression / Assessment and Plan / UC Course  I have reviewed the triage vital signs and the nursing notes.  Pertinent labs & imaging results that were available during my care of the patient were reviewed by me and considered in my medical decision making (see chart for details).  Clinical Course as of Jul 09 1405  Sat Jul 09, 2017  1405 DG Abdomen 1 View [MY]    Clinical Course User  Index [MY] Riki SheerYoung, Nyjah Schwake G, PA-C   Intermittent abdominal pain with some stool by exam and xray. No emergent or acute abd, so elected to treat for constipation today. Area of calcification on xray, though urine and presentation did not clinically correlate. He will see how he responds to treatment. Referred to PCP for f/u of this and also his elevated BP as we discussed. He expresses understanding.   Final Clinical Impressions(s) / UC Diagnoses   Final diagnoses:  None    ED Discharge Orders    None       Controlled Substance Prescriptions [place] Controlled Substance Registry consulted? Not Applicable   Sharin MonsYoung, Tedi Hughson G, PA-C 07/09/17 1508

## 2017-07-11 ENCOUNTER — Ambulatory Visit: Payer: BLUE CROSS/BLUE SHIELD | Admitting: Family Medicine

## 2017-08-05 ENCOUNTER — Other Ambulatory Visit: Payer: Self-pay

## 2017-08-05 ENCOUNTER — Encounter: Payer: Self-pay | Admitting: Urgent Care

## 2017-08-05 ENCOUNTER — Ambulatory Visit: Payer: BLUE CROSS/BLUE SHIELD | Admitting: Urgent Care

## 2017-08-05 VITALS — BP 126/68 | HR 71 | Temp 98.0°F | Resp 16 | Ht 67.0 in | Wt 185.0 lb

## 2017-08-05 DIAGNOSIS — R1084 Generalized abdominal pain: Secondary | ICD-10-CM | POA: Diagnosis not present

## 2017-08-05 DIAGNOSIS — R110 Nausea: Secondary | ICD-10-CM | POA: Diagnosis not present

## 2017-08-05 DIAGNOSIS — Z23 Encounter for immunization: Secondary | ICD-10-CM

## 2017-08-05 DIAGNOSIS — R195 Other fecal abnormalities: Secondary | ICD-10-CM | POA: Diagnosis not present

## 2017-08-05 DIAGNOSIS — R197 Diarrhea, unspecified: Secondary | ICD-10-CM | POA: Diagnosis not present

## 2017-08-05 LAB — POCT URINALYSIS DIP (MANUAL ENTRY)
BILIRUBIN UA: NEGATIVE mg/dL
Bilirubin, UA: NEGATIVE
Glucose, UA: NEGATIVE mg/dL
Leukocytes, UA: NEGATIVE
Nitrite, UA: NEGATIVE
PROTEIN UA: NEGATIVE mg/dL
RBC UA: NEGATIVE
SPEC GRAV UA: 1.025 (ref 1.010–1.025)
UROBILINOGEN UA: 0.2 U/dL
pH, UA: 5.5 (ref 5.0–8.0)

## 2017-08-05 LAB — POCT CBC
GRANULOCYTE PERCENT: 53.3 % (ref 37–80)
HCT, POC: 45.7 % (ref 43.5–53.7)
Hemoglobin: 15.2 g/dL (ref 14.1–18.1)
Lymph, poc: 1.8 (ref 0.6–3.4)
MCH, POC: 28.8 pg (ref 27–31.2)
MCHC: 33.2 g/dL (ref 31.8–35.4)
MCV: 86.5 fL (ref 80–97)
MID (CBC): 0.3 (ref 0–0.9)
MPV: 7.5 fL (ref 0–99.8)
PLATELET COUNT, POC: 219 10*3/uL (ref 142–424)
POC Granulocyte: 2.4 (ref 2–6.9)
POC LYMPH PERCENT: 40.1 %L (ref 10–50)
POC MID %: 6.6 % (ref 0–12)
RBC: 5.28 M/uL (ref 4.69–6.13)
RDW, POC: 12.4 %
WBC: 4.5 10*3/uL — AB (ref 4.6–10.2)

## 2017-08-05 MED ORDER — ONDANSETRON 8 MG PO TBDP: 8.0000 mg | ORAL_TABLET | Freq: Three times a day (TID) | ORAL | 0 refills | Status: DC | PRN

## 2017-08-05 NOTE — Patient Instructions (Addendum)
Gastroenteritis viral en los adultos Viral Gastroenteritis, Adult La gastroenteritis viral tambin se conoce como gripe estomacal. La causa de esta afeccin son diversos virus. Estos virus puede transmitirse de una persona a otra con mucha facilidad (son sumamente contagiosos). Esta afeccin podra afectar el estmago, el intestino delgado y el intestino grueso. Puede causar diarrea lquida, fiebre y vmitos repentinos. La diarrea y los vmitos pueden hacerlo sentir dbil y causar deshidratacin. Es posible que no pueda retener los lquidos. La deshidratacin puede hacerlo sentir cansado y sediento, producirle sequedad en la boca y disminuir la frecuencia con la que orina. Los adultos mayores y las personas que tienen otras enfermedades o un sistema inmunitario dbil estn en mayor riesgo de deshidratacin. Es importante restituir los lquidos que pierde por causa de la diarrea y los vmitos. Si se deshidrata gravemente, podra tener que recibir lquidos a travs de una va intravenosa(IV). Cules son las causas? La gastroenteritis es causada por diversos virus, entre los que se incluyen el rotavirus y el norovirus. El norovirus es la causa ms frecuente en los adultos. Puede contraerlo a travs de la ingesta de alimentos o agua contaminados, o por tocar superficies contaminadas con alguno de estos virus. Tambin puede enfermarse al compartir utensilios u otros artculos personales con una persona infectada. Qu incrementa el riesgo? Es ms probable que esta afeccin se manifieste en las personas que:  Tiene un sistema de defensa (sistema inmunitario) dbil.  Viven con uno o ms nios menores de 2aos.  Viven en hogares de ancianos.  Viajan en cruceros.  Cules son los signos o los sntomas? Los sntomas de esta afeccin suelen aparecer entre 1 y 2das despus de la exposicin al virus. Pueden durar varios das o incluso una semana. Los sntomas ms frecuentes son diarrea lquida y vmitos.  Otros sntomas pueden ser los siguientes:  Fiebre.  Dolor de cabeza.  Fatiga.  Dolor en el abdomen.  Escalofros.  Debilidad.  Nuseas.  Dolores musculares.  Prdida del apetito.  Cmo se diagnostica? Esta afeccin se diagnostica mediante sus antecedentes mdicos y un examen fsico. Tambin podran hacerle un anlisis de materia fecal para detectar virus u otras infecciones. Cmo se trata? Por lo general, esta afeccin desaparece por s sola. El tratamiento se centra en la restitucin de los lquidos perdidos (rehidratacin). El mdico podra recomendarle que tome una solucin de rehidratacin oral (SRO) para reemplazar sales y minerales (electrolitos) importantes en el cuerpo. En los casos ms graves, podra ser necesario administrar lquidos a travs de una va intravenosa (VI). El tratamiento tambin podra incluir medicamentos para aliviar los sntomas. Siga estas instrucciones en su casa: Siga las indicaciones del mdico sobre cmo debe cuidarse en su casa. Comida y bebida Siga estas recomendaciones como se lo haya indicado el mdico:  Tome una SRO. Esta es una bebida que se vende en farmacias y tiendas.  Beba lquidos claros en pequeas cantidades segn le sea posible. Beba lquidos claros, como agua, cubitos de hielo, jugos de fruta rebajados con agua y bebidas deportivas bajas en caloras.  En la medida en que pueda, consuma alimentos blandos y fciles de digerir en pequeas cantidades. Estos alimentos incluyen bananas, compota de manzana, arroz, carnes magras, tostadas y galletas.  Evite consumir lquidos que contengan mucha azcar o cafena, como bebidas energticas, bebidas deportivas y refrescos.  Evite el alcohol.  Evite los alimentos picantes o grasos.  Instrucciones generales   Beba suficiente lquido para mantener la orina clara o de color amarillo plido.  Lvese las manos   con frecuencia. Use desinfectante para manos si no dispone de agua y  jabn.  Asegrese de que todas las personas que viven en su casa se laven bien las manos y con frecuencia.  Tome los medicamentos de venta libre y los recetados solamente como se lo haya indicado el mdico.  Descanse en su casa mientras se recupera.  Controle su afeccin para ver si hay cambios.  Tome un bao caliente para ayudar a disminuir el ardor o el dolor causados por los episodios frecuentes de diarrea.  Concurra a todas las visitas de control como se lo haya indicado el mdico. Esto es importante. Comunquese con un mdico si:  No puede retener los lquidos.  Los sntomas empeoran.  Aparecen nuevos sntomas.  Se siente mareado o siente que va a desvanecerse.  Tiene calambres musculares. Solicite ayuda de inmediato si:  Siente dolor en el pecho.  Se siente muy dbil o se desmaya.  Observa sangre en el vmito.  El vmito se asemeja al poso del caf.  Tiene heces con sangre, de color negro, o con aspecto alquitranado.  Siente un dolor de cabeza intenso, rigidez en el cuello, o ambas cosas.  Tiene una erupcin cutnea.  Tiene dolor intenso, clicos, o meteorismo en el abdomen.  Le cuesta respirar o respira muy rpidamente.  Su corazn late muy rpidamente.  Siente la piel fra y hmeda.  Se siente confundido.  Siente dolor al orinar.  Tiene signos de deshidratacin, por ejemplo: ? Orina oscura, muy escasa o falta de orina. ? Labios agrietados. ? Boca seca. ? Ojos hundidos. ? Somnolencia. ? Debilidad. Esta informacin no tiene como fin reemplazar el consejo del mdico. Asegrese de hacerle al mdico cualquier pregunta que tenga. Document Released: 07/05/2005 Document Revised: 10/13/2016 Document Reviewed: 03/11/2015 Elsevier Interactive Patient Education  2018 Elsevier Inc.     IF you received an x-ray today, you will receive an invoice from [facility]. Please contact [facility] at [phone] with questions or concerns  regarding your invoice.   IF you received labwork today, you will receive an invoice from [facility]. Please contact [facility] at [phone] with questions or concerns regarding your invoice.   Our billing staff will not be able to assist you with questions regarding bills from these companies.  You will be contacted with the lab results as soon as they are available. The fastest way to get your results is to activate your My Chart account. Instructions are located on the last page of this paperwork. If you have not heard from us regarding the results in 2 weeks, please contact this office.      

## 2017-08-05 NOTE — Progress Notes (Signed)
MRN: [id number] DOB: 05-20-1981  Subjective:   Darren Giles is a 37 y.o. male presenting for 5 day history of subjective fever, nausea without vomiting, diarrhea from Sunday to Monday (had more than 10 bowel movements), intermittent headache the past 2 days, abdominal pain. Has been trying to eat normally again, drinking water. He is now having dark stools. Denies ongoing fevers, bloody stools, no headache today.   Darren Giles has a current medication list which includes the following prescription(s): polyethylene glycol. Also has No Known Allergies.  Darren Giles  has a past medical history of Asthma, Hypertension, and Seasonal allergies. Also  has no past surgical history on file.  Objective:   Vitals: BP 126/68   Pulse 71   Temp 98 F (36.7 C) (Oral)   Resp 16   Ht 5\' 7"  (1.702 m)   Wt 185 lb (83.9 kg)   SpO2 98%   BMI 28.98 kg/m   BP Readings from Last 3 Encounters:  08/05/17 126/68  07/09/17 (!) 149/101  12/09/15 122/82    Physical Exam  Constitutional: He is oriented to person, place, and time. He appears well-developed and well-nourished.  HENT:  Mouth/Throat: Oropharynx is clear and moist.  Cardiovascular: Normal rate, regular rhythm and intact distal pulses. Exam reveals no gallop and no friction rub.  No murmur heard. Pulmonary/Chest: No respiratory distress. He has no wheezes. He has no rales.  Abdominal: Soft. Bowel sounds are normal. He exhibits no distension and no mass. There is no tenderness. There is no guarding.  Neurological: He is alert and oriented to person, place, and time.  Skin: Skin is warm and dry.  Psychiatric: He has a normal mood and affect.   Results for orders placed or performed in visit on 08/05/17 (from the past 24 hour(s))  POCT urinalysis dipstick     Status: None   Collection Time: 08/05/17  8:53 AM  Result Value Ref Range   Color, UA yellow yellow   Clarity, UA clear clear   Glucose, UA negative negative mg/dL   Bilirubin, UA  negative negative   Ketones, POC UA negative negative mg/dL   Spec Grav, UA 4.098 1.191 - 1.025   Blood, UA negative negative   pH, UA 5.5 5.0 - 8.0   Protein Ur, POC negative negative mg/dL   Urobilinogen, UA 0.2 0.2 or 1.0 E.U./dL   Nitrite, UA Negative Negative   Leukocytes, UA Negative Negative  POCT CBC     Status: Abnormal   Collection Time: 08/05/17  8:58 AM  Result Value Ref Range   WBC 4.5 (A) 4.6 - 10.2 K/uL   Lymph, poc 1.8 0.6 - 3.4   POC LYMPH PERCENT 40.1 10 - 50 %L   MID (cbc) 0.3 0 - 0.9   POC MID % 6.6 0 - 12 %M   POC Granulocyte 2.4 2 - 6.9   Granulocyte percent 53.3 37 - 80 %G   RBC 5.28 4.69 - 6.13 M/uL   Hemoglobin 15.2 14.1 - 18.1 g/dL   HCT, POC 47.8 29.5 - 53.7 %   MCV 86.5 80 - 97 fL   MCH, POC 28.8 27 - 31.2 pg   MCHC 33.2 31.8 - 35.4 g/dL   RDW, POC 62.1 %   Platelet Count, POC 219 142 - 424 K/uL   MPV 7.5 0 - 99.8 fL   Assessment and Plan :   Generalized abdominal pain - Plan: POCT CBC, Basic metabolic panel, POC Hemoccult Bld/Stl (1-Cd Office Dx), Stool culture  Nausea without vomiting - Plan: POCT CBC, Basic metabolic panel, Stool culture  Diarrhea, unspecified type - Plan: POCT urinalysis dipstick, Stool culture  Dark stools - Plan: POC Hemoccult Bld/Stl (1-Cd Office Dx)  Need for influenza vaccination - Plan: Flu Vaccine QUAD 36+ mos IM  Patient will take the fecal occult blood card and stool culture home. For now we will manage his symptoms supportively for a viral gastroenteritis. Return-to-clinic precautions discussed, patient verbalized understanding.   Wallis BambergMario Kadeem Hyle, PA-C [facility] [phone] 08/05/2017  8:43 AM

## 2017-08-06 LAB — BASIC METABOLIC PANEL
BUN / CREAT RATIO: 12 (ref 9–20)
BUN: 9 mg/dL (ref 6–20)
CO2: 23 mmol/L (ref 20–29)
Calcium: 9.1 mg/dL (ref 8.7–10.2)
Chloride: 102 mmol/L (ref 96–106)
Creatinine, Ser: 0.76 mg/dL (ref 0.76–1.27)
GFR calc Af Amer: 138 mL/min/{1.73_m2} (ref >59)
GFR calc non Af Amer: 119 mL/min/{1.73_m2} (ref >59)
GLUCOSE: 93 mg/dL (ref 65–99)
POTASSIUM: 4.2 mmol/L (ref 3.5–5.2)
Sodium: 138 mmol/L (ref 134–144)

## 2017-08-08 ENCOUNTER — Encounter: Payer: Self-pay | Admitting: Urgent Care

## 2017-08-11 ENCOUNTER — Ambulatory Visit (INDEPENDENT_AMBULATORY_CARE_PROVIDER_SITE_OTHER): Payer: BLUE CROSS/BLUE SHIELD | Admitting: Urgent Care

## 2017-08-11 ENCOUNTER — Encounter: Payer: Self-pay | Admitting: Urgent Care

## 2017-08-11 VITALS — BP 126/83 | HR 72 | Temp 98.2°F | Resp 16 | Ht 67.0 in | Wt 188.0 lb

## 2017-08-11 DIAGNOSIS — R51 Headache: Secondary | ICD-10-CM

## 2017-08-11 DIAGNOSIS — R519 Headache, unspecified: Secondary | ICD-10-CM

## 2017-08-11 NOTE — Progress Notes (Signed)
    MRN: [id number] DOB: 1981-01-08  Subjective:   Darren Giles is a 37 y.o. male presenting for follow up on gastroenteritis. Reports that his diarrhea, nausea without vomiting. However, he still has headaches daily. Headaches are bilateral temporal or frontal, last hours, is associated with weakness. Denies confusion, dizziness, blurred vision, speech difficulty, numbness or tingling. He is trying to hydrate better. Sleeps 7-8 hours per night. Has about 2 meals per day. Denies smoking cigarettes. Drinks on occasion, last time he drank was ~2 months ago. Has 2 drinks of coffee daily. Works in [employer], performs strenuous work. Has good relationships at home, has a good support network.   Darren Giles is not currently taking any medications and has No Known Allergies.  Darren Giles  has a past medical history of Asthma, Hypertension, and Seasonal allergies. Denies past surgical history.  Objective:   Vitals: BP 126/83   Pulse 72   Temp 98.2 F (36.8 C) (Oral)   Resp 16   Ht 5\' 7"  (1.702 m)   Wt 188 lb (85.3 kg)   SpO2 98%   BMI 29.44 kg/m   BP Readings from Last 3 Encounters:  08/11/17 126/83  08/05/17 126/68  07/09/17 (!) 149/101    Physical Exam  Constitutional: He is oriented to person, place, and time. He appears well-developed and well-nourished.  HENT:  Mouth/Throat: Oropharynx is clear and moist.  Eyes: EOM are normal. Pupils are equal, round, and reactive to light. No scleral icterus.  Cardiovascular: Normal rate, regular rhythm and intact distal pulses. Exam reveals no gallop and no friction rub.  No murmur heard. Pulmonary/Chest: No respiratory distress. He has no wheezes. He has no rales.  Abdominal: Soft. Bowel sounds are normal. He exhibits no distension and no mass. There is no tenderness. There is no guarding.  Neurological: He is alert and oriented to person, place, and time. He displays normal reflexes. No cranial nerve deficit. Coordination normal.  Skin: Skin  is warm and dry.  Psychiatric: He has a normal mood and affect.   Assessment and Plan :   Temporal headache  Frequent headaches  At the end of his visit, patient admits that he regularly feels congested. For now, recommended patient hydrate well, start allergy medications, eat 3 regular healthy, balanced meals. If he has not improvement, will refer to headache clinic. Follow up prn.  Wallis BambergMario Brees Hounshell, PA-C [facility] [phone] 08/11/2017 3:37 PM

## 2017-08-11 NOTE — Patient Instructions (Addendum)
Tome 500mg  de Tylenol con ibuprofen 600mg  cada 6 horas con comida para dolor de cabeza. Para alergias, puedes tomar Zyrtec 10mg  diaramente, Flonase 2 sprays diaramente.    Dolor de cabeza general sin causa General Headache Without Cause El dolor de cabeza es un dolor o malestar que se siente en la zona de la cabeza o del cuello. Puede no tener una causa especfica. Hay muchas causas y tipos de dolores de [place]. Los dolores de cabeza ms comunes son los siguientes:  Cefalea tensional.  Cefaleas migraosas.  Cefalea en brotes.  Cefaleas diarias crnicas.  Siga estas indicaciones en su casa:  Controle su afeccin para detectar cualquier cambio. Siga estos pasos para Facilities manager afeccin: Control del [employer] medicamentos de venta libre y los recetados solamente como se lo haya indicado el mdico.  Cuando sienta dolor de cabeza acustese en un cuarto oscuro y tranquilo.  Si se lo indican, aplique hielo sobre la cabeza y la zona del cuello: ? Ponga el hielo en una bolsa plstica. ? Coloque una [employer] piel y la bolsa de hielo. ? Deje el hielo durante , 2 a 3veces por da.  Utilice una almohadilla trmica o tome una ducha con agua caliente para aplicar calor en la cabeza y la zona del cuello como se lo haya indicado el mdico.  Mantenga las luces tenues si las luces brillantes le [place] o sus dolores de cabeza empeoran. Qu debe comer y beber  Mantenga un horario para las comidas.  Limite el consumo de bebidas alcohlicas.  Consuma menos cantidad de cafena o deje de tomarla. Instrucciones generales  Concurra a todas las visitas de seguimiento como se lo haya indicado el mdico. Esto es importante.  Lleve un diario de los dolores de cabeza para Financial risk analyst qu factores pueden desencadenarlos. Por ejemplo, escriba: ? Lo que usted come y bebe. ? Cunto tiempo duerme. ? Algn cambio en su dieta o en los medicamentos.  Pruebe algunas tcnicas de  relajacin, como los [place].  Limite el estrs.  Sintese con la espalda recta y no tense los msculos.  No consuma productos que contengan tabaco, incluidos cigarrillos, tabaco de Theatre manager o cigarrillos electrnicos. Si necesita ayuda para dejar de fumar, consulte al mdico.  Haga actividad fsica habitualmente como se lo haya indicado el mdico.  Tenga un horario fijo para dormir. Duerma entre 7 y 9horas o la cantidad de horas que le haya recomendado el mdico. Comunquese con un mdico si:  Los medicamentos no Materials engineer los sntomas.  Tiene un dolor de cabeza que es diferente del dolor de cabeza habitual.  Tiene nuseas o vmitos.  Tiene fiebre. Solicite ayuda de inmediato si:  El dolor se vuelve cada vez ms intenso.  Ha vomitado repetidas veces.  Presenta rigidez en el cuello.  Sufre prdida de la visin.  Tiene problemas para hablar.  Siente dolor en el ojo o en el odo.  Presenta debilidad muscular o prdida del control muscular.  Pierde el equilibrio o tiene problemas para Advertising account planner.  Sufre mareos o se desmaya.  Experimenta confusin. Esta informacin no tiene Theme park manager el consejo del mdico. Asegrese de hacerle al mdico cualquier pregunta que tenga. Document Released: 04/14/2005 Document Revised: 10/25/2016 Document Reviewed: 10/28/2014 Elsevier Interactive Patient Education  2018 [employer].     IF you received an x-ray today, you will receive an invoice from [facility]. Please contact [facility] at [phone] with questions or concerns regarding your invoice.  IF you received labwork today, you will receive an invoice from [facility]. Please contact [facility] at [phone] with questions or concerns regarding your invoice.   Our billing staff will not be able to assist you with questions regarding bills from these companies.  You will be contacted with the lab results as soon as they are available. The fastest  way to get your results is to activate your My Chart account. Instructions are located on the last page of this paperwork. If you have not heard from us regarding the results in 2 weeks, please contact this office.

## 2018-05-10 ENCOUNTER — Emergency Department (HOSPITAL_COMMUNITY)
Admission: EM | Admit: 2018-05-10 | Discharge: 2018-05-10 | Disposition: A | Discharge status: Home / Self Care | Payer: No Typology Code available for payment source | Attending: Emergency Medicine | Admitting: Emergency Medicine

## 2018-05-10 ENCOUNTER — Encounter (HOSPITAL_COMMUNITY): Payer: Self-pay | Admitting: *Deleted

## 2018-05-10 ENCOUNTER — Emergency Department (HOSPITAL_COMMUNITY): Payer: No Typology Code available for payment source

## 2018-05-10 DIAGNOSIS — Y92410 Unspecified street and highway as the place of occurrence of the external cause: Secondary | ICD-10-CM | POA: Diagnosis not present

## 2018-05-10 DIAGNOSIS — Y9389 Activity, other specified: Secondary | ICD-10-CM | POA: Diagnosis not present

## 2018-05-10 DIAGNOSIS — S80911A Unspecified superficial injury of right knee, initial encounter: Secondary | ICD-10-CM | POA: Diagnosis present

## 2018-05-10 DIAGNOSIS — J45909 Unspecified asthma, uncomplicated: Secondary | ICD-10-CM | POA: Insufficient documentation

## 2018-05-10 DIAGNOSIS — Z87891 Personal history of nicotine dependence: Secondary | ICD-10-CM | POA: Diagnosis not present

## 2018-05-10 DIAGNOSIS — S8000XA Contusion of unspecified knee, initial encounter: Secondary | ICD-10-CM | POA: Insufficient documentation

## 2018-05-10 DIAGNOSIS — Y999 Unspecified external cause status: Secondary | ICD-10-CM | POA: Diagnosis not present

## 2018-05-10 DIAGNOSIS — I10 Essential (primary) hypertension: Secondary | ICD-10-CM | POA: Diagnosis not present

## 2018-05-10 MED ORDER — ACETAMINOPHEN 325 MG PO TABS: 650.0000 mg | ORAL_TABLET | Freq: Four times a day (QID) | ORAL | 0 refills | Status: DC | PRN

## 2018-05-10 MED ORDER — METHOCARBAMOL 500 MG PO TABS: 500.0000 mg | ORAL_TABLET | Freq: Two times a day (BID) | ORAL | 0 refills | Status: DC
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
## 2018-10-05 ENCOUNTER — Other Ambulatory Visit: Payer: Self-pay

## 2018-10-05 ENCOUNTER — Ambulatory Visit
Admission: EM | Admit: 2018-10-05 | Discharge: 2018-10-05 | Disposition: A | Discharge status: Home / Self Care | Payer: No Typology Code available for payment source

## 2018-10-05 DIAGNOSIS — T1592XA Foreign body on external eye, part unspecified, left eye, initial encounter: Secondary | ICD-10-CM | POA: Diagnosis not present

## 2018-10-05 NOTE — ED Provider Notes (Signed)
[facility]    CSN: [id number] Arrival date & time: 10/05/18  1007     History   Chief Complaint Chief Complaint  Patient presents with  . Eye Pain    HPI Zaman Ringold is a 38 y.o. male.   38 year old male comes in for less than 24-hour history of possible foreign body in left eye.  States he was grinding metal yesterday around 12 PM, although he was wearing protective eyewear, states felt pain to the eye, and since has foreign body sensation.  He has had tearing as well as drainage in the morning with crusting.  Denies vision changes.  Does have conjunctival injection, photophobia.  States tried to rinse the eye out with water without relief, and came in for evaluation.  Denies contact lens use.     Past Medical History:  Diagnosis Date  . Asthma   . Hypertension   . Seasonal allergies     There are no active problems to display for this patient.   History reviewed. No pertinent surgical history.     Home Medications    Prior to Admission medications   Medication Sig Start Date End Date Taking? Authorizing Provider  acetaminophen (TYLENOL) 325 MG tablet Take 2 tablets (650 mg total) by mouth every 6 (six) hours as needed. 05/10/18   Khatri, Hina, PA-C  methocarbamol (ROBAXIN) 500 MG tablet Take 1 tablet (500 mg total) by mouth 2 (two) times daily. 05/10/18   Khatri, Hillary Bow, PA-C  polyethylene glycol (MIRALAX) packet 1 packet daily in water or coffee until regular bowel movement then as needed 07/09/17   Riki Sheer, PA-C    Family History Family History  Problem Relation Age of Onset  . Hypotension Mother     Social History Social History   Tobacco Use  . Smoking status: Former Games developer  . Smokeless tobacco: Never Used  Substance Use Topics  . Alcohol use: Yes    Comment: social use  . Drug use: No     Allergies   Patient has no known allergies.   Review of Systems Review of Systems  Reason unable to perform ROS: See  HPI as above.     Physical Exam Triage Vital Signs ED Triage Vitals [10/05/18 1022]  Enc Vitals Group     BP (!) 141/90     Pulse Rate 76     Resp 18     Temp 98.3 F (36.8 C)     Temp Source Oral     SpO2 97 %     Weight      Height      Head Circumference      Peak Flow      Pain Score 2     Pain Loc      Pain Edu?      Excl. in GC?    No data found.  Updated Vital Signs BP (!) 141/90 (BP Location: Left Arm)   Pulse 76   Temp 98.3 F (36.8 C) (Oral)   Resp 18   SpO2 97%   Visual Acuity Right Eye Distance: 20/50 ([place]) Left Eye Distance: 20/40 ([place]) Bilateral Distance: 20/40 ()  Right Eye Near:   Left Eye Near:    Bilateral Near:     Physical Exam Constitutional:      General: He is not in acute distress.    Appearance: He is well-developed. He is not diaphoretic.  HENT:     Head: Normocephalic and atraumatic.  Eyes:  General: Lids are normal. Lids are everted, no foreign bodies appreciated.     Extraocular Movements: Extraocular movements intact.     Conjunctiva/sclera:     Right eye: Right conjunctiva is not injected.     Left eye: Left conjunctiva is injected.     Pupils: Pupils are equal, round, and reactive to light.     Comments: Rust ring with small foreign body seen to the 9 o'clock region of mid iris.   Fluorescein stain with uptake around the foreign body.  Negative Seidel sign.  Neck:     Musculoskeletal: Normal range of motion and neck supple.  Skin:    General: Skin is warm and dry.  Neurological:     Mental Status: He is alert and oriented to person, place, and time.      UC Treatments / Results  Labs (all labs ordered are listed, but only abnormal results are displayed) Labs Reviewed - No data to display  EKG None  Radiology No results found.  Procedures Procedures (including critical care time)  Medications Ordered in UC Medications - No data to display  Initial Impression / Assessment and Plan / UC Course  I  have reviewed the triage vital signs and the nursing notes.  Pertinent labs & imaging results that were available during my care of the patient were reviewed by me and considered in my medical decision making (see chart for details).    38 year old male with left eye foreign body.  VA 20/50 OD 20/40 OS 20/40 OU ([place]).  Fluorescein stain with uptake around the foreign body, negative Seidel sign. Cotton swab used to attempt for foreign body removal with partial removal. Still with negative seidel sign on recheck. Will send patient to ophthalmology for further evaluation and management needed.  Made appointment for patient with [facility] at 1pm for further evaluation and management needed. Return precautions given. Patient expresses understanding and agrees to plan.  Final Clinical Impressions(s) / UC Diagnoses   Final diagnoses:  Foreign body of left eye, initial encounter    ED Prescriptions    None        Belinda Fisher, PA-C 10/05/18 1051

## 2018-10-05 NOTE — Discharge Instructions (Signed)
Please go to your appointment at 1 PM for foreign body removal.  If in the meantime, experiencing extreme pain, worsening drainage, vision changes/loss, go to the emergency department for further evaluation.

## 2018-10-05 NOTE — ED Triage Notes (Signed)
Pt states grinding metal yesterday, now having pain to lt eye with drainage

## 2019-10-26 ENCOUNTER — Ambulatory Visit: Payer: Self-pay | Attending: Internal Medicine

## 2019-10-26 DIAGNOSIS — Z23 Encounter for immunization: Secondary | ICD-10-CM

## 2019-10-26 NOTE — Progress Notes (Signed)
   Covid-19 Vaccination Clinic  Name:  Quavis Klutz    MRN: [id number] DOB: 05-30-1981  10/26/2019  Mr. Gamal Todisco was observed post Covid-19 immunization for 15 minutes without incident. He was provided with Vaccine Information Sheet and instruction to access the V-Safe system.   Mr. Mickie Kozikowski was instructed to call 911 with any severe reactions post vaccine: Marland Kitchen Difficulty breathing  . Swelling of face and throat  . A fast heartbeat  . A bad rash all over body  . Dizziness and weakness   Immunizations Administered    Name Date Dose VIS Date Route   Pfizer COVID-19 Vaccine 10/26/2019  4:21 PM 0.3 mL 06/29/2019 Intramuscular   Manufacturer: [employer], [employer]   Lot: [id number]   NDC: [address]-9

## 2019-11-19 ENCOUNTER — Ambulatory Visit: Payer: Self-pay | Attending: Internal Medicine

## 2019-11-19 DIAGNOSIS — Z23 Encounter for immunization: Secondary | ICD-10-CM

## 2019-11-19 NOTE — Progress Notes (Signed)
   Covid-19 Vaccination Clinic  Name:  Yobany Vroom    MRN: [id number] DOB: [birth year]  11/19/2019  Mr. Edinson Domeier was observed post Covid-19 immunization for 15 minutes without incident. He was provided with Vaccine Information Sheet and instruction to access the V-Safe system.   Mr. Haruto Demaria was instructed to call 911 with any severe reactions post vaccine: Marland Kitchen Difficulty breathing  . Swelling of face and throat  . A fast heartbeat  . A bad rash all over body  . Dizziness and weakness   Immunizations Administered    Name Date Dose VIS Date Route   Pfizer COVID-19 Vaccine 11/19/2019 11:08 AM 0.3 mL 09/12/2018 Intramuscular   Manufacturer: [employer], [employer]   Lot: [id number]   NDC: [address]-0
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
## 2022-01-21 ENCOUNTER — Ambulatory Visit (INDEPENDENT_AMBULATORY_CARE_PROVIDER_SITE_OTHER): Payer: Self-pay

## 2022-01-21 ENCOUNTER — Ambulatory Visit (HOSPITAL_COMMUNITY)
Admission: EM | Admit: 2022-01-21 | Discharge: 2022-01-21 | Disposition: A | Discharge status: Home / Self Care | Payer: Self-pay | Attending: Physician Assistant | Admitting: Physician Assistant

## 2022-01-21 ENCOUNTER — Encounter (HOSPITAL_COMMUNITY): Payer: Self-pay | Admitting: Emergency Medicine

## 2022-01-21 DIAGNOSIS — Z23 Encounter for immunization: Secondary | ICD-10-CM

## 2022-01-21 DIAGNOSIS — S6991XA Unspecified injury of right wrist, hand and finger(s), initial encounter: Secondary | ICD-10-CM

## 2022-01-21 DIAGNOSIS — M25531 Pain in right wrist: Secondary | ICD-10-CM

## 2022-01-21 DIAGNOSIS — W294XXA Contact with nail gun, initial encounter: Secondary | ICD-10-CM

## 2022-01-21 MED ORDER — TETANUS-DIPHTH-ACELL PERTUSSIS 5-2.5-18.5 LF-MCG/0.5 IM SUSY
PREFILLED_SYRINGE | INTRAMUSCULAR | Status: AC
  Filled 2022-01-21: qty 0.5

## 2022-01-21 MED ORDER — MUPIROCIN 2 % EX OINT: 1.0000 | TOPICAL_OINTMENT | Freq: Every day | CUTANEOUS | 0 refills | Status: AC

## 2022-01-21 MED ORDER — TETANUS-DIPHTH-ACELL PERTUSSIS 5-2.5-18.5 LF-MCG/0.5 IM SUSY
0.5000 mL | PREFILLED_SYRINGE | Freq: Once | INTRAMUSCULAR | Status: AC
  Administered 2022-01-21: 0.5 mL via INTRAMUSCULAR

## 2022-01-21 MED ORDER — NAPROXEN 375 MG PO TABS: 375.0000 mg | ORAL_TABLET | Freq: Two times a day (BID) | ORAL | 0 refills | Status: DC

## 2022-01-21 NOTE — ED Provider Notes (Signed)
[facility]    CSN: [id number] Arrival date & time: 01/21/22  1819      History   Chief Complaint Chief Complaint  Patient presents with   Hand Injury    HPI Darren Giles is a 41 y.o. male.   Patient presents today with a 1 hour history of right hand and wrist pain following injury.  Reports that he was working at his house when a nail gun went off and caused the nail to go through his right proximal palm into exit dorsal wrist.  He was able to remove this without significant bleeding.  He is unsure when his last tetanus was.  Reports pain is gradually been worsening and is currently rated 6/7 on a 0-10 pain scale, described as throbbing, worse with flexion of wrist, no alleviating factors identified.  Denies any numbness, tingling, paresthesias of hand.  He has not tried any over-the-counter medication for symptom management.  He did clean area with soap and water but has not applied any medication.  He is left-handed.    Past Medical History:  Diagnosis Date   Asthma    Hypertension    Seasonal allergies     There are no problems to display for this patient.   History reviewed. No pertinent surgical history.     Home Medications    Prior to Admission medications   Medication Sig Start Date End Date Taking? Authorizing Provider  mupirocin ointment (BACTROBAN) 2 % Apply 1 Application topically daily. 01/21/22  Yes Sibel Khurana K, PA-C  naproxen (NAPROSYN) 375 MG tablet Take 1 tablet (375 mg total) by mouth 2 (two) times daily. 01/21/22  Yes Chirstina Haan, Noberto Retort, PA-C  acetaminophen (TYLENOL) 325 MG tablet Take 2 tablets (650 mg total) by mouth every 6 (six) hours as needed. 05/10/18   Khatri, Hina, PA-C  methocarbamol (ROBAXIN) 500 MG tablet Take 1 tablet (500 mg total) by mouth 2 (two) times daily. 05/10/18   Khatri, Hillary Bow, PA-C  polyethylene glycol (MIRALAX) packet 1 packet daily in water or coffee until regular bowel movement then as needed 07/09/17   Riki Sheer, PA-C    Family History Family History  Problem Relation Age of Onset   Hypotension Mother     Social History Social History   Tobacco Use   Smoking status: Former   Smokeless tobacco: Never  Substance Use Topics   Alcohol use: Yes    Comment: social use   Drug use: No     Allergies   Patient has no known allergies.   Review of Systems Review of Systems  Constitutional:  Positive for activity change. Negative for appetite change, fatigue and fever.  Musculoskeletal:  Positive for arthralgias. Negative for myalgias.  Skin:  Positive for wound.  Neurological:  Negative for weakness and numbness.     Physical Exam Triage Vital Signs ED Triage Vitals  Enc Vitals Group     BP 01/21/22 1828 (!) 150/99     Pulse Rate 01/21/22 1828 93     Resp 01/21/22 1828 18     Temp 01/21/22 1828 98 F (36.7 C)     Temp src --      SpO2 01/21/22 1828 97 %     Weight --      Height --      Head Circumference --      Peak Flow --      Pain Score 01/21/22 1829 0     Pain Loc --  Pain Edu? --      Excl. in GC? --    No data found.  Updated Vital Signs BP (!) 150/99   Pulse 93   Temp 98 F (36.7 C)   Resp 18   SpO2 97%   Visual Acuity Right Eye Distance:   Left Eye Distance:   Bilateral Distance:    Right Eye Near:   Left Eye Near:    Bilateral Near:     Physical Exam Vitals reviewed.  Constitutional:      General: He is awake.     Appearance: Normal appearance. He is well-developed. He is not ill-appearing.     Comments: Very pleasant male appears stated age in no acute distress sitting comfortably in exam room  HENT:     Head: Normocephalic and atraumatic.  Cardiovascular:     Rate and Rhythm: Normal rate and regular rhythm.     Heart sounds: Normal heart sounds, S1 normal and S2 normal. No murmur heard.    Comments: Capillary fill within 2 seconds right fingers Pulmonary:     Effort: Pulmonary effort is normal.     Breath sounds: Normal  breath sounds. No stridor. No wheezing, rhonchi or rales.  Musculoskeletal:     Right wrist: Swelling, laceration, tenderness and bony tenderness present. No snuff box tenderness. Decreased range of motion.     Right hand: No swelling, tenderness or bony tenderness. Normal range of motion. There is no disruption of two-point discrimination. Normal capillary refill.     Comments: Right wrist: Tenderness palpation over radial wrist.  No deformity noted.  Normal active range of motion with pain.  Right hand: Normal active range of motion of phalanges.  No tenderness palpation.  Hand neurovascularly intact.  Skin:    Comments: 0.5 cm wound on right palm and small wound noted right dorsal wrist.  No bleeding or drainage noted.  Neurological:     Mental Status: He is alert.  Psychiatric:        Behavior: Behavior is cooperative.          UC Treatments / Results  Labs (all labs ordered are listed, but only abnormal results are displayed) Labs Reviewed - No data to display  EKG   Radiology DG Wrist Complete Right  Result Date: 01/21/2022 CLINICAL DATA:  Pain and swelling after injury with nail gun. EXAM: RIGHT WRIST - COMPLETE 3+ VIEW COMPARISON:  None Available. FINDINGS: There is no evidence of fracture or dislocation. There is no evidence of arthropathy or other focal bone abnormality. Mild soft tissue edema. No radiopaque foreign body. No soft tissue gas IMPRESSION: Mild soft tissue edema. No fracture or radiopaque foreign body. Electronically Signed   By: Narda Rutherford M.D.   On: 01/21/2022 18:47    Procedures Procedures (including critical care time)  Medications Ordered in UC Medications  Tdap (BOOSTRIX) injection 0.5 mL (0.5 mLs Intramuscular Given 01/21/22 1834)    Initial Impression / Assessment and Plan / UC Course  I have reviewed the triage vital signs and the nursing notes.  Pertinent labs & imaging results that were available during my care of the patient were  reviewed by me and considered in my medical decision making (see chart for details).     X-ray was obtained that showed no acute abnormality.  Tetanus updated today.  Area was cleaned with chlorhexidine and dressed in clinic today.  Patient reports that nail was clean and has no concern for contaminated wound.  He was encouraged  to keep this area clean and apply Bactroban ointment to open wounds.  We will use Naprosyn for pain.  He was instructed not to take NSAIDs with this medication.  Discussed that he should follow-up with hand specialist was given contact information for local provider with instruction to call to schedule an appointment.  Recommended conservative treatment measures in the meantime including RICE protocol.  He can use a brace for additional symptom relief.  Discussed that if he has any worsening symptoms including swelling, pain, discoloration, numbness, tingling, decreased range of motion he needs to be seen immediately to which he expressed understanding.  Strict return precautions given.  Final Clinical Impressions(s) / UC Diagnoses   Final diagnoses:  Injury of right hand by nail gun, initial encounter     Discharge Instructions      Keep area clean with soap and water.  Apply Bactroban ointment to wound.  Start Naprosyn for pain.  Do not take NSAIDs with this medication including aspirin, ibuprofen/Advil, naproxen/Aleve.  You can use Tylenol for breakthrough pain.  Use compression, elevation, ice for additional symptom relief.  I do recommend you follow-up with a hand specialist.  Please call to schedule an appointment.  If you have any worsening symptoms including swelling, bruising, decreased range of motion, increased pain, numbness, tingling, drainage from the wound you need to go to the emergency room immediately.     ED Prescriptions     Medication Sig Dispense Auth. Provider   mupirocin ointment (BACTROBAN) 2 % Apply 1 Application topically daily. 22 g Chiquita Heckert,  Alphonse Asbridge K, PA-C   naproxen (NAPROSYN) 375 MG tablet Take 1 tablet (375 mg total) by mouth 2 (two) times daily. 20 tablet Miabella Shannahan, Noberto Retort, PA-C      PDMP not reviewed this encounter.   Jeani Hawking, PA-C 01/21/22 1903

## 2022-01-21 NOTE — ED Triage Notes (Signed)
Pt is present today with right hand pain. Pt states that he was accidentally shot with a nail gun. Pt states the nail went through his hand.

## 2022-01-21 NOTE — Discharge Instructions (Addendum)
Keep area clean with soap and water.  Apply Bactroban ointment to wound.  Start Naprosyn for pain.  Do not take NSAIDs with this medication including aspirin, ibuprofen/Advil, naproxen/Aleve.  You can use Tylenol for breakthrough pain.  Use compression, elevation, ice for additional symptom relief.  I do recommend you follow-up with a hand specialist.  Please call to schedule an appointment.  If you have any worsening symptoms including swelling, bruising, decreased range of motion, increased pain, numbness, tingling, drainage from the wound you need to go to the emergency room immediately.

## 2022-05-09 ENCOUNTER — Emergency Department (HOSPITAL_COMMUNITY)
Admission: EM | Admit: 2022-05-09 | Discharge: 2022-05-10 | Disposition: A | Discharge status: Home / Self Care | Payer: Self-pay | Attending: Emergency Medicine | Admitting: Emergency Medicine

## 2022-05-09 ENCOUNTER — Emergency Department (HOSPITAL_COMMUNITY): Payer: Self-pay

## 2022-05-09 ENCOUNTER — Other Ambulatory Visit: Payer: Self-pay

## 2022-05-09 ENCOUNTER — Encounter (HOSPITAL_COMMUNITY): Payer: Self-pay | Admitting: *Deleted

## 2022-05-09 DIAGNOSIS — W25XXXA Contact with sharp glass, initial encounter: Secondary | ICD-10-CM | POA: Insufficient documentation

## 2022-05-09 DIAGNOSIS — S61211A Laceration without foreign body of left index finger without damage to nail, initial encounter: Secondary | ICD-10-CM | POA: Insufficient documentation

## 2022-05-09 NOTE — ED Triage Notes (Addendum)
Pt states that a mirror fell onto his left hand (second and third fingers). Laceration just over the knuckles. Difficulty bending his fingers. Recent tetanus.

## 2022-05-10 MED ORDER — LIDOCAINE HCL (PF) 1 % IJ SOLN
10.0000 mL | Freq: Once | INTRAMUSCULAR | Status: AC
  Administered 2022-05-10: 10 mL
  Filled 2022-05-10: qty 10

## 2022-05-10 MED ORDER — BACITRACIN ZINC 500 UNIT/GM EX OINT: TOPICAL_OINTMENT | Freq: Two times a day (BID) | CUTANEOUS | Status: DC

## 2022-05-10 NOTE — ED Provider Notes (Signed)
[facility] EMERGENCY DEPARTMENT Provider Note   CSN: [id number] Arrival date & time: 05/09/22  2239     History  Chief Complaint  Patient presents with   Finger Injury    Darren Giles is a 41 y.o. male who presents to the ED with left hand injury that occurred shortly PTA. Patient reports he was taking down a mirror, it broke and he subsequently cut his 2nd & 3rd fingers. Since the injury he cannot fully bend the 2nd finger. He is left hand dominant. Denies numbness, tingling or other areas of injury. Had tetanus shot 2 months prior.   Patient speaks spanish & english, offered translator- patient declined.  HPI     Home Medications Prior to Admission medications   Medication Sig Start Date End Date Taking? Authorizing Provider  acetaminophen (TYLENOL) 325 MG tablet Take 2 tablets (650 mg total) by mouth every 6 (six) hours as needed. 05/10/18   Khatri, Hina, PA-C  methocarbamol (ROBAXIN) 500 MG tablet Take 1 tablet (500 mg total) by mouth 2 (two) times daily. 05/10/18   Khatri, Hina, PA-C  mupirocin ointment (BACTROBAN) 2 % Apply 1 Application topically daily. 01/21/22   Raspet, Derry Skill, PA-C  naproxen (NAPROSYN) 375 MG tablet Take 1 tablet (375 mg total) by mouth 2 (two) times daily. 01/21/22   Raspet, Derry Skill, PA-C  polyethylene glycol (MIRALAX) packet 1 packet daily in water or coffee until regular bowel movement then as needed 07/09/17   Bjorn Pippin, PA-C      Allergies    Patient has no known allergies.    Review of Systems   Review of Systems  Constitutional:  Negative for chills and fever.  Respiratory:  Negative for shortness of breath.   Cardiovascular:  Negative for chest pain.  Musculoskeletal:  Positive for arthralgias.  Skin:  Positive for wound.  Neurological:  Negative for numbness.  All other systems reviewed and are negative.   Physical Exam Updated Vital Signs BP (!) 143/106   Pulse 75   Temp 98 F (36.7 C) (Oral)    Resp 16   SpO2 98%  Physical Exam Vitals and nursing note reviewed.  Constitutional:      General: He is not in acute distress.    Appearance: He is well-developed.  HENT:     Head: Normocephalic and atraumatic.  Eyes:     General:        Right eye: No discharge.        Left eye: No discharge.     Conjunctiva/sclera: Conjunctivae normal.  Cardiovascular:     Comments: 2+ radial pulses Pulmonary:     Effort: No respiratory distress.  Musculoskeletal:     Comments: Upper extremities: Crescent shaped lacerations to the dorsum of the left 2nd & 3rd PIP joints. No active bleeding. No visible Fbs. 2nd digit wound is 2.5 cm in length, curved, and is 3 mm deep, skin flap present. 3rd digit laceration more superficial- no SQ tissue exposure. Intact AROM throughout and against resistance with the exception of left 2nd digit. Patient is unable to flex the 2nd PIP & DIP joint simultaneously however when isolated he is able to flex each of these joints. He is able to fully extend the finger against some resistance.  TTP over the 2nd/3rd PIP joints. Otherwise no significant tenderness to palpation.   Skin:    General: Skin is warm and dry.     Capillary Refill: Capillary refill takes less than 2  seconds.  Neurological:     Mental Status: He is alert.     Comments: Sensation grossly intact to bilateral upper extremities. Good grip strength bilaterally. Clear speech.   Psychiatric:        Behavior: Behavior normal.        Thought Content: Thought content normal.     ED Results / Procedures / Treatments   Labs (all labs ordered are listed, but only abnormal results are displayed) Labs Reviewed - No data to display  EKG None  Radiology DG Hand Complete Left  Result Date: 05/10/2022 CLINICAL DATA:  Left finger injury.  Lacerations from broken glass. EXAM: LEFT HAND - COMPLETE 3+ VIEW COMPARISON:  01/03/2004 FINDINGS: Tiny osseous fragments over the distal phalangeal tuft of the second  finger. This may indicate avulsion injury. No other fracture suggested. Degenerative changes in the interphalangeal joints. No radiopaque soft tissue foreign bodies. IMPRESSION: Tiny ununited ossicles over the distal phalangeal tuft of the second finger, possibly avulsion injury. No radiopaque foreign bodies. Electronically Signed   By: Lucienne Capers M.D.   On: 05/10/2022 00:07    Procedures .Marland KitchenLaceration Repair  Date/Time: 05/10/2022 4:17 AM  Performed by: Amaryllis Dyke, PA-C Authorized by: Amaryllis Dyke, PA-C   Consent:    Consent obtained:  Verbal   Consent given by:  Patient   Risks, benefits, and alternatives were discussed: yes     Risks discussed:  Infection, need for additional repair, nerve damage, poor wound healing, poor cosmetic result, pain, retained foreign body, tendon damage and vascular damage   Alternatives discussed:  No treatment Anesthesia:    Anesthesia method:  Nerve block   Block location:  Left 2nd finger   Block needle gauge:  25 G   Block anesthetic:  Lidocaine 1% w/o epi   Block injection procedure:  Anatomic landmarks identified, anatomic landmarks palpated, negative aspiration for blood, introduced needle and incremental injection   Block outcome:  Anesthesia achieved Laceration details:    Location:  Finger   Finger location:  L index finger   Length (cm):  2.5   Depth (mm):  3 Pre-procedure details:    Preparation:  Patient was prepped and draped in usual sterile fashion and imaging obtained to evaluate for foreign bodies Exploration:    Hemostasis achieved with:  Direct pressure Treatment:    Area cleansed with:  Povidone-iodine   Amount of cleaning:  Standard   Irrigation solution:  Sterile water   Irrigation method:  Pressure wash Skin repair:    Repair method:  Sutures   Suture size:  4-0   Suture material:  Nylon   Suture technique:  Simple interrupted   Number of sutures:  2 Approximation:    Approximation:   Close Repair type:    Repair type:  Simple Post-procedure details:    Dressing:  Antibiotic ointment and splint for protection   Procedure completion:  Tolerated well, no immediate complications     Medications Ordered in ED Medications - No data to display  ED Course/ Medical Decision Making/ A&P                           Medical Decision Making Amount and/or Complexity of Data Reviewed Radiology: ordered.  Risk OTC drugs. Prescription drug management.   Patient presents to the ED S/p left hand injury to 2nd/3rd fingers.  Nontoxic, BP mildly elevated doubt HTN emergency.   Chart/nursing notes viewed.  Xray ordered in  triage reviewed & interpreted by me- radiologist impression: Tiny ununited ossicles over the distal phalangeal tuft of the second finger, possibly avulsion injury. No radiopaque foreign bodies. ---> nontender to distal phalanx, open wound is not in this location, however patient is having difficulty with flexion of the PIP/DIP simultaneously- will place in finger splint and have him follow up with hand surgery. Wound cleansed, irrigated, visualized in bloodless field, no obvious FB or visible tendon injury. Repaired per note above. Tetanus is up to date. Will splint and have patient follow up. I discussed results, treatment plan, need for follow-up, and return precautions with the patient. Provided opportunity for questions, patient confirmed understanding and is in agreement with plan.   Discussed w/ attending Dr. Leonette Monarch who has evaluated the patient & is in agreement.   Final Clinical Impression(s) / ED Diagnoses Final diagnoses:  Laceration of left index finger without foreign body without damage to nail, initial encounter    Rx / DC Orders ED Discharge Orders     None         Amaryllis Dyke, PA-C 05/10/22 0451    Fatima Blank, MD 05/10/22 [phone]

## 2022-05-10 NOTE — Discharge Instructions (Signed)
You were seen in the emergency department today for a laceration. Your laceration was closed with 2 stitches. Please keep this area clean and dry for the next 24 hours, after 24 hours you may get this area wet, but avoid soaking the area. Keep the finger in the splint until you are able to follow up with hand surgery, given you have trouble moving the finger we are concerned you could have a more serious injury- please call Dr. Angus Palms office this morning to schedule an appointment.   You will need to have the stitches removed and the wound rechecked in 7 days. Please return to the emergency department, go to an urgent care, or see your primary care provider to have this performed- you may also discuss this with hand surgery. Return to the ER soon should you start to experience pus type drainage from the wound, redness around the wound, or fevers as this could indicate the area is infected, please return to the ER for any other worsening symptoms or concerns that you may have.

## 2022-05-18 ENCOUNTER — Encounter (HOSPITAL_COMMUNITY): Payer: Self-pay | Admitting: Orthopedic Surgery

## 2022-05-18 ENCOUNTER — Other Ambulatory Visit: Payer: Self-pay

## 2022-05-18 NOTE — Progress Notes (Addendum)
Mr. Darren Giles denies chest pain or shortness of breath.  Patient denies having any s/s of Covid in his household, also denies any known exposure to Covid.   Mr. Darren Giles does not have a PCP, it is documented that patient has HTN, patient states he was not aware and he does not think he has been on any medication for HTN.  In the ED on 05/09/22, Darren Giles's blood pressure was 143/106. On 01/21/22, in the ED Darren Giles's blood pressure was 150/99.  I encouraged patient to watch salt intake today and to drink lots of water, patient may drink water until 1130 in am. I encouraged Mr. Darren Giles to get a PCP, patient said that he is planning to do so.

## 2022-05-18 NOTE — H&P (Cosign Needed)
  Darren Giles is an 41 y.o. male.   Chief Complaint: LEFT INDEX FINGER INJURY  HPI: the patient is a 41 year old left-hand dominant male who sustained a laceration to the left index finger on 05/09/22 when installing a mirror. He was seen in the ED and initial treatment of cleaning the wound and bandaging it was completed.  He was seen in our office for further evaluation. He continues to have swelling, stiffness, and pain.  He is here today for surgery.  He denies chest pain, shortness of breath, fever, chills, nausea, vomiting, or diarrhea.   Past Medical History:  Diagnosis Date   Asthma    Hypertension    Seasonal allergies     No past surgical history on file.  Family History  Problem Relation Age of Onset   Hypotension Mother    Social History:  reports that he has quit smoking. He has never used smokeless tobacco. He reports current alcohol use. He reports that he does not use drugs.  Allergies: No Known Allergies  No medications prior to admission.    No results found for this or any previous visit (from the past 48 hour(s)). No results found.  ROS NO RECENT ILLNESSES OR HOSPITALIZATIONS  There were no vitals taken for this visit. Physical Exam  General Appearance:  Alert, cooperative, no distress, appears stated age  Head:  Normocephalic, without obvious abnormality, atraumatic  Eyes:  Pupils equal, conjunctiva/corneas clear,         Throat: Lips, mucosa, and tongue normal; teeth and gums normal  Neck: No visible masses     Lungs:   respirations unlabored  Chest Wall:  No tenderness or deformity  Heart:  Regular rate and rhythm,  Abdomen:   Soft, non-tender,         Extremities: Laceration on the dorsum of the index finger at the PIP joint extending distally. Sutures remain in place. No erythema, ecchymosis, or drainage. Sensation intact to light touch distally. Capillary refill less than 2 seconds. Unable to flex DIP joint but able to flex and  extend PIP and MP joints. Zone 3 extensor tendon injury.  Pulses: 2+ and symmetric  Skin: Skin color, texture, turgor normal, no rashes or lesions     Neurologic: Normal     Assessment/Plan LEFT INDEX FINGER LACERATION WITH TENDON INVOLVEMENT     - LEFT INDEX FINGER LACERATION AND TENDON REPAIR  R/B/A DISCUSSED WITH PT IN OFFICE.  PT VOICED UNDERSTANDING OF PLAN CONSENT SIGNED DAY OF SURGERY PT SEEN AND EXAMINED PRIOR TO OPERATIVE PROCEDURE/DAY OF SURGERY SITE MARKED. QUESTIONS ANSWERED WILL GO HOME FOLLOWING SURGERY   WE ARE PLANNING SURGERY FOR YOUR UPPER EXTREMITY. THE RISKS AND BENEFITS OF SURGERY INCLUDE BUT NOT LIMITED TO BLEEDING INFECTION, DAMAGE TO NEARBY NERVES ARTERIES TENDONS, FAILURE OF SURGERY TO ACCOMPLISH ITS INTENDED GOALS, PERSISTENT SYMPTOMS AND NEED FOR FURTHER SURGICAL INTERVENTION. WITH THIS IN MIND WE WILL PROCEED. I HAVE DISCUSSED WITH THE PATIENT THE PRE AND POSTOPERATIVE REGIMEN AND THE DOS AND DON'TS. PT VOICED UNDERSTANDING AND INFORMED CONSENT SIGNED.   Darren Planas MD  Darren Giles 05/18/2022, 1:22 PM

## 2022-05-19 ENCOUNTER — Ambulatory Visit (HOSPITAL_COMMUNITY)
Admission: RE | Admit: 2022-05-19 | Discharge: 2022-05-19 | Disposition: A | Discharge status: Home / Self Care | Payer: Self-pay | Source: Ambulatory Visit | Attending: Orthopedic Surgery | Admitting: Orthopedic Surgery

## 2022-05-19 ENCOUNTER — Encounter (HOSPITAL_COMMUNITY): Payer: Self-pay | Admitting: Orthopedic Surgery

## 2022-05-19 ENCOUNTER — Other Ambulatory Visit: Payer: Self-pay

## 2022-05-19 ENCOUNTER — Encounter (HOSPITAL_COMMUNITY)
Admission: RE | Disposition: A | Discharge status: Home / Self Care | Payer: Self-pay | Source: Ambulatory Visit | Attending: Orthopedic Surgery

## 2022-05-19 ENCOUNTER — Ambulatory Visit (HOSPITAL_BASED_OUTPATIENT_CLINIC_OR_DEPARTMENT_OTHER): Payer: Self-pay | Admitting: Anesthesiology

## 2022-05-19 ENCOUNTER — Ambulatory Visit (HOSPITAL_COMMUNITY): Payer: Self-pay | Admitting: Anesthesiology

## 2022-05-19 DIAGNOSIS — S66321A Laceration of extensor muscle, fascia and tendon of left index finger at wrist and hand level, initial encounter: Secondary | ICD-10-CM | POA: Insufficient documentation

## 2022-05-19 DIAGNOSIS — I10 Essential (primary) hypertension: Secondary | ICD-10-CM | POA: Insufficient documentation

## 2022-05-19 DIAGNOSIS — Z87891 Personal history of nicotine dependence: Secondary | ICD-10-CM

## 2022-05-19 DIAGNOSIS — J45909 Unspecified asthma, uncomplicated: Secondary | ICD-10-CM

## 2022-05-19 DIAGNOSIS — S66121D Laceration of flexor muscle, fascia and tendon of left index finger at wrist and hand level, subsequent encounter: Secondary | ICD-10-CM

## 2022-05-19 DIAGNOSIS — S61219A Laceration without foreign body of unspecified finger without damage to nail, initial encounter: Secondary | ICD-10-CM

## 2022-05-19 DIAGNOSIS — W25XXXA Contact with sharp glass, initial encounter: Secondary | ICD-10-CM | POA: Insufficient documentation

## 2022-05-19 HISTORY — PX: LACERATION REPAIR: SHX5284

## 2022-05-19 LAB — CBC
HCT: 46 % (ref 39.0–52.0)
Hemoglobin: 15.8 g/dL (ref 13.0–17.0)
MCH: 28.7 pg (ref 26.0–34.0)
MCHC: 34.3 g/dL (ref 30.0–36.0)
MCV: 83.5 fL (ref 80.0–100.0)
Platelets: 217 10*3/uL (ref 150–400)
RBC: 5.51 MIL/uL (ref 4.22–5.81)
RDW: 12.2 % (ref 11.5–15.5)
WBC: 6.3 10*3/uL (ref 4.0–10.5)
nRBC: 0 % (ref 0.0–0.2)

## 2022-05-19 LAB — BASIC METABOLIC PANEL
Anion gap: 8 (ref 5–15)
BUN: 9 mg/dL (ref 6–20)
CO2: 27 mmol/L (ref 22–32)
Calcium: 9 mg/dL (ref 8.9–10.3)
Chloride: 103 mmol/L (ref 98–111)
Creatinine, Ser: 0.9 mg/dL (ref 0.61–1.24)
GFR, Estimated: >60 mL/min (ref >60)
Glucose, Bld: 98 mg/dL (ref 70–99)
Potassium: 4 mmol/L (ref 3.5–5.1)
Sodium: 138 mmol/L (ref 135–145)

## 2022-05-19 SURGERY — REPAIR, LACERATION, 2 OR MORE
Anesthesia: LOCAL | Laterality: Left

## 2022-05-19 SURGERY — REPAIR, LACERATION, 2 OR MORE
Anesthesia: Monitor Anesthesia Care | Laterality: Left

## 2022-05-19 MED ORDER — ACETAMINOPHEN 10 MG/ML IV SOLN: 1000.0000 mg | Freq: Once | INTRAVENOUS | Status: DC | PRN

## 2022-05-19 MED ORDER — ORAL CARE MOUTH RINSE: 15.0000 mL | Freq: Once | OROMUCOSAL | Status: AC

## 2022-05-19 MED ORDER — ONDANSETRON HCL 4 MG/2ML IJ SOLN
INTRAMUSCULAR | Status: DC | PRN
  Administered 2022-05-19: 4 mg via INTRAVENOUS

## 2022-05-19 MED ORDER — LIDOCAINE HCL (PF) 1 % IJ SOLN
INTRAMUSCULAR | Status: AC
  Filled 2022-05-19: qty 30

## 2022-05-19 MED ORDER — MIDAZOLAM HCL 2 MG/2ML IJ SOLN
INTRAMUSCULAR | Status: DC | PRN
  Administered 2022-05-19: 2 mg via INTRAVENOUS

## 2022-05-19 MED ORDER — FENTANYL CITRATE (PF) 100 MCG/2ML IJ SOLN
INTRAMUSCULAR | Status: AC
  Filled 2022-05-19: qty 2

## 2022-05-19 MED ORDER — HYDROCODONE-ACETAMINOPHEN 5-325 MG PO TABS: 1.0000 | ORAL_TABLET | ORAL | 0 refills | Status: AC | PRN

## 2022-05-19 MED ORDER — LIDOCAINE HCL (PF) 1 % IJ SOLN
INTRAMUSCULAR | Status: DC | PRN
  Administered 2022-05-19: 10 mL

## 2022-05-19 MED ORDER — PROPOFOL 500 MG/50ML IV EMUL
INTRAVENOUS | Status: DC | PRN
  Administered 2022-05-19: 100 ug/kg/min via INTRAVENOUS

## 2022-05-19 MED ORDER — MIDAZOLAM HCL 2 MG/2ML IJ SOLN
INTRAMUSCULAR | Status: AC
  Filled 2022-05-19: qty 2

## 2022-05-19 MED ORDER — LIDOCAINE 2% (20 MG/ML) 5 ML SYRINGE
INTRAMUSCULAR | Status: DC | PRN
  Administered 2022-05-19: 100 mg via INTRAVENOUS

## 2022-05-19 MED ORDER — CHLORHEXIDINE GLUCONATE 0.12 % MT SOLN
15.0000 mL | Freq: Once | OROMUCOSAL | Status: AC
  Administered 2022-05-19: 15 mL via OROMUCOSAL
  Filled 2022-05-19: qty 15

## 2022-05-19 MED ORDER — FENTANYL CITRATE (PF) 100 MCG/2ML IJ SOLN
INTRAMUSCULAR | Status: DC | PRN
  Administered 2022-05-19: 25 ug via INTRAVENOUS
  Administered 2022-05-19: 50 ug via INTRAVENOUS
  Administered 2022-05-19: 25 ug via INTRAVENOUS

## 2022-05-19 MED ORDER — LACTATED RINGERS IV SOLN: INTRAVENOUS | Status: DC

## 2022-05-19 MED ORDER — BUPIVACAINE HCL (PF) 0.25 % IJ SOLN
INTRAMUSCULAR | Status: AC
  Filled 2022-05-19: qty 10

## 2022-05-19 MED ORDER — BUPIVACAINE HCL (PF) 0.25 % IJ SOLN
INTRAMUSCULAR | Status: DC | PRN
  Administered 2022-05-19: 10 mL

## 2022-05-19 MED ORDER — ONDANSETRON HCL 4 MG/2ML IJ SOLN
INTRAMUSCULAR | Status: AC
  Filled 2022-05-19: qty 2

## 2022-05-19 MED ORDER — BUPIVACAINE HCL (PF) 0.25 % IJ SOLN
INTRAMUSCULAR | Status: AC
  Filled 2022-05-19: qty 30

## 2022-05-19 MED ORDER — CEFAZOLIN SODIUM-DEXTROSE 2-4 GM/100ML-% IV SOLN
2.0000 g | INTRAVENOUS | Status: AC
  Administered 2022-05-19: 2 g via INTRAVENOUS
  Filled 2022-05-19: qty 100

## 2022-05-19 MED ORDER — PROPOFOL 1000 MG/100ML IV EMUL
INTRAVENOUS | Status: AC
  Filled 2022-05-19: qty 100

## 2022-05-19 MED ORDER — FENTANYL CITRATE (PF) 100 MCG/2ML IJ SOLN: 25.0000 ug | INTRAMUSCULAR | Status: DC | PRN

## 2022-05-19 SURGICAL SUPPLY — 58 items
APPLIER CLIP 9.375 SM OPEN (CLIP)
BAG COUNTER SPONGE SURGICOUNT (BAG) ×1 IMPLANT
BAG DECANTER FOR FLEXI CONT (MISCELLANEOUS) ×1 IMPLANT
BAG SPNG CNTER NS LX DISP (BAG) ×1
BNDG CMPR 75X21 PLY HI ABS (MISCELLANEOUS) ×1
BNDG COHESIVE 1X5 TAN STRL LF (GAUZE/BANDAGES/DRESSINGS) IMPLANT
BNDG ELASTIC 3X5.8 VLCR STR LF (GAUZE/BANDAGES/DRESSINGS) IMPLANT
BNDG ELASTIC 4X5.8 VLCR STR LF (GAUZE/BANDAGES/DRESSINGS) IMPLANT
BNDG ESMARK 4X9 LF (GAUZE/BANDAGES/DRESSINGS) ×1 IMPLANT
BNDG GAUZE DERMACEA FLUFF 4 (GAUZE/BANDAGES/DRESSINGS) IMPLANT
BNDG GZE DERMACEA 4 6PLY (GAUZE/BANDAGES/DRESSINGS)
CLIP APPLIE 9.375 SM OPEN (CLIP) IMPLANT
CORD BIPOLAR FORCEPS 12FT (ELECTRODE) ×1 IMPLANT
COVER SURGICAL LIGHT HANDLE (MISCELLANEOUS) ×1 IMPLANT
CUFF TOURN SGL QUICK 18X4 (TOURNIQUET CUFF) ×1 IMPLANT
CUFF TOURN SGL QUICK 24 (TOURNIQUET CUFF)
CUFF TRNQT CYL 24X4X16.5-23 (TOURNIQUET CUFF) IMPLANT
DRAPE OEC MINIVIEW 54X84 (DRAPES) IMPLANT
DRAPE SURG 17X23 STRL (DRAPES) ×1 IMPLANT
DRSG ADAPTIC 3X8 NADH LF (GAUZE/BANDAGES/DRESSINGS) IMPLANT
DRSG EMULSION OIL 3X3 NADH (GAUZE/BANDAGES/DRESSINGS) IMPLANT
GAUZE SPONGE 4X4 12PLY STRL (GAUZE/BANDAGES/DRESSINGS) IMPLANT
GAUZE STRETCH 2X75IN STRL (MISCELLANEOUS) IMPLANT
GAUZE XEROFORM 5X9 LF (GAUZE/BANDAGES/DRESSINGS) ×1 IMPLANT
GEL ULTRASOUND 20GR AQUASONIC (MISCELLANEOUS) IMPLANT
GLOVE BIOGEL PI IND STRL 8.5 (GLOVE) ×1 IMPLANT
GLOVE SURG ORTHO 8.0 STRL STRW (GLOVE) ×1 IMPLANT
GOWN STRL REUS W/ TWL LRG LVL3 (GOWN DISPOSABLE) ×2 IMPLANT
GOWN STRL REUS W/ TWL XL LVL3 (GOWN DISPOSABLE) ×1 IMPLANT
GOWN STRL REUS W/TWL LRG LVL3 (GOWN DISPOSABLE) ×3
GOWN STRL REUS W/TWL XL LVL3 (GOWN DISPOSABLE) ×1
KIT BASIN OR (CUSTOM PROCEDURE TRAY) ×1 IMPLANT
KIT TURNOVER KIT B (KITS) ×1 IMPLANT
LOOP VESSEL MAXI BLUE (MISCELLANEOUS) IMPLANT
MANIFOLD NEPTUNE II (INSTRUMENTS) ×1 IMPLANT
NDL HYPO 25GX1X1/2 BEV (NEEDLE) IMPLANT
NEEDLE HYPO 25GX1X1/2 BEV (NEEDLE) IMPLANT
NS IRRIG 1000ML POUR BTL (IV SOLUTION) ×1 IMPLANT
PACK ORTHO EXTREMITY (CUSTOM PROCEDURE TRAY) ×1 IMPLANT
PAD ARMBOARD 7.5X6 YLW CONV (MISCELLANEOUS) ×2 IMPLANT
PAD CAST 4YDX4 CTTN HI CHSV (CAST SUPPLIES) IMPLANT
PADDING CAST COTTON 4X4 STRL (CAST SUPPLIES)
SOAP 2 % CHG 4 OZ (WOUND CARE) ×1 IMPLANT
SPEAR EYE SURG WECK-CEL (MISCELLANEOUS) IMPLANT
SUCTION FRAZIER HANDLE 10FR (MISCELLANEOUS) ×1
SUCTION TUBE FRAZIER 10FR DISP (MISCELLANEOUS) IMPLANT
SUT ETHIBOND 4 0 TF (SUTURE) IMPLANT
SUT FIBERWIRE 4-0 18 DIAM BLUE (SUTURE)
SUT MERSILENE 4 0 P 3 (SUTURE) IMPLANT
SUT PROLENE 4 0 PS 2 18 (SUTURE) IMPLANT
SUT PROLENE 4 0 TF (SUTURE) IMPLANT
SUTURE FIBERWR 4-0 18 DIA BLUE (SUTURE) IMPLANT
SYR CONTROL 10ML LL (SYRINGE) IMPLANT
TOWEL GREEN STERILE (TOWEL DISPOSABLE) ×1 IMPLANT
TOWEL GREEN STERILE FF (TOWEL DISPOSABLE) ×1 IMPLANT
TUBE CONNECTING 12X1/4 (SUCTIONS) IMPLANT
UNDERPAD 30X36 HEAVY ABSORB (UNDERPADS AND DIAPERS) ×1 IMPLANT
WATER STERILE IRR 1000ML POUR (IV SOLUTION) ×1 IMPLANT

## 2022-05-19 NOTE — Anesthesia Preprocedure Evaluation (Signed)
Anesthesia Evaluation  Patient identified by MRN, date of birth, ID band Patient awake    Reviewed: Allergy & Precautions, NPO status , Patient's Chart, lab work & pertinent test results  Airway Mallampati: II  TM Distance: >3 FB Neck ROM: Full    Dental no notable dental hx.    Pulmonary asthma , former smoker,    Pulmonary exam normal        Cardiovascular hypertension,  Rhythm:Regular Rate:Normal     Neuro/Psych negative neurological ROS  negative psych ROS   GI/Hepatic negative GI ROS, Neg liver ROS,   Endo/Other    Renal/GU negative Renal ROS  negative genitourinary   Musculoskeletal Finger laceration   Abdominal Normal abdominal exam  (+)   Peds  Hematology negative hematology ROS (+)   Anesthesia Other Findings   Reproductive/Obstetrics                             Anesthesia Physical Anesthesia Plan  ASA: 2  Anesthesia Plan: MAC   Post-op Pain Management:    Induction: Intravenous  PONV Risk Score and Plan: 1 and Ondansetron, Dexamethasone, Propofol infusion, Midazolam and Treatment may vary due to age or medical condition  Airway Management Planned: Simple Face Mask, Natural Airway and Nasal Cannula  Additional Equipment: None  Intra-op Plan:   Post-operative Plan:   Informed Consent: I have reviewed the patients History and Physical, chart, labs and discussed the procedure including the risks, benefits and alternatives for the proposed anesthesia with the patient or authorized representative who has indicated his/her understanding and acceptance.     Dental advisory given  Plan Discussed with: CRNA  Anesthesia Plan Comments:         Anesthesia Quick Evaluation

## 2022-05-19 NOTE — Progress Notes (Signed)
R/B/A DISCUSSED WITH PT IN OFFICE.  PT VOICED UNDERSTANDING OF PLAN CONSENT SIGNED DAY OF SURGERY PT SEEN AND EXAMINED PRIOR TO OPERATIVE PROCEDURE/DAY OF SURGERY SITE MARKED. QUESTIONS ANSWERED WILL GO HOME FOLLOWING SURGERY  

## 2022-05-19 NOTE — Anesthesia Postprocedure Evaluation (Signed)
Anesthesia Post Note  Patient: Darren Giles  Procedure(s) Performed: REPAIR INDEX FINGER LACERATION AND TENDON REPAIR (Left)     Patient location during evaluation: PACU Anesthesia Type: MAC Level of consciousness: awake and alert Pain management: pain level controlled Vital Signs Assessment: post-procedure vital signs reviewed and stable Respiratory status: spontaneous breathing, nonlabored ventilation, respiratory function stable and patient connected to nasal cannula oxygen Cardiovascular status: stable and blood pressure returned to baseline Postop Assessment: no apparent nausea or vomiting Anesthetic complications: no   No notable events documented.  Last Vitals:  Vitals:   05/19/22 1600 05/19/22 1615  BP: 115/87 114/83  Pulse: 65 62  Resp: 17 16  Temp:  36.5 C  SpO2: 99% 98%    Last Pain:  Vitals:   05/19/22 1615  TempSrc:   PainSc: 0-No pain                 Belenda Cruise P Sumayah Bearse

## 2022-05-19 NOTE — Op Note (Signed)
PREOPERATIVE DIAGNOSIS:Left index finger laceration with tendon involvement  POSTOPERATIVE DIAGNOSIS:Same  ATTENDING SURGEON: Dr. Iran Planas who scrubbed and present for the entire procedure  ASSISTANT SURGEON: Gertie Fey, PA-C who scrubbed necessary for the entire procedure helped aid in tendon repair closure and splinting in a timely fashion  ANESTHESIA: 1% Xylocaine cortisone Marcaine local block with IV sedation  OPERATIVE PROCEDURE: Left index finger extensor tendon repair, zone 3 Left index finger proximal interphalangeal joint arthrotomy exploration and drainage  IMPLANTS: None  EBL: Minimal  RADIOGRAPHIC INTERPRETATION: None  SURGICAL INDICATIONS: Patient is a right-hand-dominant gentleman who sustained the laceration to the dorsal aspect of left index finger.  Patient was seen and evaluated the office and recommend undergo the above procedure.  The risks of surgery include but not limited to bleeding infection damage nearby nerves arteries or tendons loss of motion of the wrist and digits incomplete relief of symptoms and need for further surgical invention.  A signed informed consent was obtained the day of surgery.  SURGICAL TECHNIQUE: The patient was prepped identified in the preoperative holding area marked apart a marker made the left index finger to indicate correct operative site.  Patient brought back to operating placed supine on the anesthesia table where the IV sedation was administered.  Preoperative antibiotics were given prior to skin incision.  The local anesthetic was administered.  A well-padded tourniquet placed on the left forearm and sealed with the appropriate drape.  Left upper extremity then prepped and draped normal sterile fashion.  A timeout was called the correct site was identified the procedure then begun.  Attention then turned to the left index finger.  The previous laceration was extended proximally distally.  Dissection carried down through the  skin and subcutaneous tissue after the tourniquet insufflated.  The extensor mechanism was identified.  The patient did have the near complete laceration at the level of the zone 3.  There was entrance into the joint.  Joint arthrotomy was then extended exposing the joint.  Did not appear to be any cartilaginous injuries to the middle or proximal phalange ease.  The arthrotomy was then thoroughly irrigated.  Joint exploration and drainage was then done.  Following this the extensor tendon was then repaired with a running locking Ethibond 4-0 suture.  This was supplemented by several horizontal mattress 4-0 Ethibond sutures.  Following this the skin was then closed with simple skin sutures 4-0 Prolene.  Adaptic dressing sterile compressive bandage then applied.  The patient then placed in a small finger splint taken recovery in good condition.  POSTOPERATIVE PLAN: Patient be discharged home.  See him back in the office in 10 to 14 days for wound check suture removal.  Keep him in the finger splint for a total of 6 weeks.  We will see if we get him into therapy we can just strictly immobilize the PIP joint to allow the DIP free.  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  No radiographs the first visit.

## 2022-05-19 NOTE — Transfer of Care (Signed)
Immediate Anesthesia Transfer of Care Note  Patient: Darren Giles  Procedure(s) Performed: REPAIR INDEX FINGER LACERATION AND TENDON REPAIR (Left)  Patient Location: PACU  Anesthesia Type:MAC  Level of Consciousness: alert  and oriented  Airway & Oxygen Therapy: Patient Spontanous Breathing and Patient connected to face mask oxygen  Post-op Assessment: Report given to RN and Post -op Vital signs reviewed and stable  Post vital signs: Reviewed and stable  Last Vitals:  Vitals Value Taken Time  BP 122/83 05/19/22 1535  Temp    Pulse 72 05/19/22 1537  Resp 13 05/19/22 1537  SpO2 98 % 05/19/22 1537  Vitals shown include unvalidated device data.  Last Pain:  Vitals:   05/19/22 1242  TempSrc:   PainSc: 0-No pain      Patients Stated Pain Goal: 0 (67/12/45 8099)  Complications: No notable events documented.

## 2022-05-19 NOTE — Discharge Instructions (Signed)
KEEP BANDAGE CLEAN AND DRY °CALL OFFICE FOR F/U APPT [phone] °KEEP HAND ELEVATED ABOVE HEART °OK TO APPLY ICE TO OPERATIVE AREA °CONTACT OFFICE IF ANY WORSENING PAIN OR CONCERNS. °

## 2022-05-20 ENCOUNTER — Encounter (HOSPITAL_COMMUNITY): Payer: Self-pay | Admitting: Orthopedic Surgery

## 2023-04-27 ENCOUNTER — Encounter (HOSPITAL_COMMUNITY): Payer: Self-pay

## 2023-04-27 ENCOUNTER — Ambulatory Visit (HOSPITAL_COMMUNITY)
Admission: EM | Admit: 2023-04-27 | Discharge: 2023-04-27 | Disposition: A | Discharge status: Home / Self Care | Payer: Self-pay | Attending: Internal Medicine | Admitting: Internal Medicine

## 2023-04-27 DIAGNOSIS — S161XXA Strain of muscle, fascia and tendon at neck level, initial encounter: Secondary | ICD-10-CM

## 2023-04-27 MED ORDER — CYCLOBENZAPRINE HCL 5 MG PO TABS: 5.0000 mg | ORAL_TABLET | Freq: Every day | ORAL | 0 refills | Status: AC

## 2023-04-27 NOTE — Discharge Instructions (Signed)
Counter ibuprofen 3 tablets every 6 hours with food as needed for pain can also take over-the-counter acetaminophen as directed on the package. Ice application for 2 to 3 days then switch to moist heat.  Gentle stretching exercises.  Follow-up if symptoms fail to improve, new symptoms or concerns
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
